# Patient Record
Sex: Female | Born: 1935
Health system: Southern US, Community
[De-identification: ages and names within clinical notes are randomized; demographics above are authoritative.]

## PROBLEM LIST (undated history)

## (undated) DIAGNOSIS — M1990 Unspecified osteoarthritis, unspecified site: Secondary | ICD-10-CM

## (undated) DIAGNOSIS — D500 Iron deficiency anemia secondary to blood loss (chronic): Secondary | ICD-10-CM

## (undated) DIAGNOSIS — K219 Gastro-esophageal reflux disease without esophagitis: Secondary | ICD-10-CM

## (undated) DIAGNOSIS — I10 Essential (primary) hypertension: Secondary | ICD-10-CM

## (undated) DIAGNOSIS — R748 Abnormal levels of other serum enzymes: Secondary | ICD-10-CM

## (undated) DIAGNOSIS — Z86010 Personal history of colonic polyps: Secondary | ICD-10-CM

## (undated) DIAGNOSIS — E0590 Thyrotoxicosis, unspecified without thyrotoxic crisis or storm: Secondary | ICD-10-CM

## (undated) DIAGNOSIS — K922 Gastrointestinal hemorrhage, unspecified: Secondary | ICD-10-CM

## (undated) DIAGNOSIS — C4442 Squamous cell carcinoma of skin of scalp and neck: Secondary | ICD-10-CM

## (undated) DIAGNOSIS — K5730 Diverticulosis of large intestine without perforation or abscess without bleeding: Secondary | ICD-10-CM

## (undated) DIAGNOSIS — L409 Psoriasis, unspecified: Secondary | ICD-10-CM

## (undated) HISTORY — PX: APPENDECTOMY: SHX54

## (undated) HISTORY — DX: Psoriasis, unspecified: L40.9

## (undated) HISTORY — DX: Gastrointestinal hemorrhage, unspecified: K92.2

## (undated) HISTORY — DX: Personal history of colonic polyps: Z86.010

## (undated) HISTORY — DX: Essential (primary) hypertension: I10

## (undated) HISTORY — PX: ABDOMINAL HYSTERECTOMY: SHX81

## (undated) HISTORY — DX: Squamous cell carcinoma of skin of scalp and neck: C44.42

## (undated) HISTORY — DX: Gastro-esophageal reflux disease without esophagitis: K21.9

## (undated) HISTORY — DX: Diverticulosis of large intestine without perforation or abscess without bleeding: K57.30

## (undated) HISTORY — PX: SPINAL FUSION: SHX223

## (undated) HISTORY — DX: Unspecified osteoarthritis, unspecified site: M19.90

## (undated) HISTORY — DX: Thyrotoxicosis, unspecified without thyrotoxic crisis or storm: E05.90

## (undated) HISTORY — DX: Iron deficiency anemia secondary to blood loss (chronic): D50.0

## (undated) HISTORY — PX: ESOPHAGOGASTRODUODENOSCOPY: SHX1529

## (undated) HISTORY — DX: Abnormal levels of other serum enzymes: R74.8

---

## 1997-11-13 ENCOUNTER — Encounter: Payer: Self-pay | Admitting: Internal Medicine

## 1997-11-13 ENCOUNTER — Ambulatory Visit (HOSPITAL_COMMUNITY): Admission: RE | Admit: 1997-11-13 | Discharge: 1997-11-13 | Payer: Self-pay | Admitting: Internal Medicine

## 1998-10-01 ENCOUNTER — Ambulatory Visit (HOSPITAL_COMMUNITY): Admission: RE | Admit: 1998-10-01 | Discharge: 1998-10-01 | Payer: Self-pay | Admitting: Internal Medicine

## 1998-10-01 ENCOUNTER — Encounter: Payer: Self-pay | Admitting: Internal Medicine

## 1998-10-08 ENCOUNTER — Encounter: Payer: Self-pay | Admitting: Internal Medicine

## 1998-10-08 ENCOUNTER — Ambulatory Visit (HOSPITAL_COMMUNITY): Admission: RE | Admit: 1998-10-08 | Discharge: 1998-10-08 | Payer: Self-pay | Admitting: Internal Medicine

## 1998-11-27 ENCOUNTER — Ambulatory Visit (HOSPITAL_COMMUNITY): Admission: RE | Admit: 1998-11-27 | Discharge: 1998-11-27 | Payer: Self-pay | Admitting: Internal Medicine

## 1998-11-27 ENCOUNTER — Encounter: Payer: Self-pay | Admitting: Internal Medicine

## 1999-12-01 ENCOUNTER — Encounter: Payer: Self-pay | Admitting: Internal Medicine

## 1999-12-01 ENCOUNTER — Ambulatory Visit (HOSPITAL_COMMUNITY): Admission: RE | Admit: 1999-12-01 | Discharge: 1999-12-01 | Payer: Self-pay | Admitting: Internal Medicine

## 2000-04-19 ENCOUNTER — Encounter: Payer: Self-pay | Admitting: Emergency Medicine

## 2000-04-19 ENCOUNTER — Emergency Department (HOSPITAL_COMMUNITY): Admission: EM | Admit: 2000-04-19 | Discharge: 2000-04-19 | Payer: Self-pay | Admitting: Emergency Medicine

## 2000-12-17 ENCOUNTER — Encounter: Payer: Self-pay | Admitting: Internal Medicine

## 2000-12-17 ENCOUNTER — Ambulatory Visit (HOSPITAL_COMMUNITY): Admission: RE | Admit: 2000-12-17 | Discharge: 2000-12-17 | Payer: Self-pay | Admitting: Internal Medicine

## 2002-03-13 ENCOUNTER — Ambulatory Visit (HOSPITAL_COMMUNITY): Admission: RE | Admit: 2002-03-13 | Discharge: 2002-03-13 | Payer: Self-pay | Admitting: Internal Medicine

## 2002-03-13 ENCOUNTER — Encounter: Payer: Self-pay | Admitting: Internal Medicine

## 2003-03-28 ENCOUNTER — Ambulatory Visit (HOSPITAL_COMMUNITY): Admission: RE | Admit: 2003-03-28 | Discharge: 2003-03-28 | Payer: Self-pay | Admitting: Internal Medicine

## 2003-11-27 ENCOUNTER — Ambulatory Visit: Payer: Self-pay | Admitting: Internal Medicine

## 2004-01-15 ENCOUNTER — Ambulatory Visit: Payer: Self-pay | Admitting: Internal Medicine

## 2004-05-29 ENCOUNTER — Ambulatory Visit (HOSPITAL_COMMUNITY): Admission: RE | Admit: 2004-05-29 | Discharge: 2004-05-29 | Payer: Self-pay | Admitting: Internal Medicine

## 2004-11-28 ENCOUNTER — Ambulatory Visit: Payer: Self-pay | Admitting: Internal Medicine

## 2005-02-11 ENCOUNTER — Ambulatory Visit: Payer: Self-pay | Admitting: Internal Medicine

## 2005-09-11 ENCOUNTER — Ambulatory Visit: Payer: Self-pay | Admitting: Internal Medicine

## 2005-09-14 ENCOUNTER — Ambulatory Visit (HOSPITAL_COMMUNITY): Admission: RE | Admit: 2005-09-14 | Discharge: 2005-09-14 | Payer: Self-pay | Admitting: Internal Medicine

## 2006-02-24 ENCOUNTER — Ambulatory Visit: Payer: Self-pay | Admitting: Internal Medicine

## 2006-02-24 LAB — CONVERTED CEMR LAB
AST: 45 units/L — ABNORMAL HIGH (ref 0–37)
BUN: 10 mg/dL (ref 6–23)
CO2: 29 meq/L (ref 19–32)
Cholesterol: 193 mg/dL (ref 0–200)
Creatinine, Ser: 0.9 mg/dL (ref 0.4–1.2)
Eosinophils Absolute: 0.2 10*3/uL (ref 0.0–0.6)
GFR calc Af Amer: 78 mL/min
Glucose, Bld: 75 mg/dL (ref 70–99)
MCHC: 35.2 g/dL (ref 30.0–36.0)
MCV: 86.3 fL (ref 78.0–100.0)
Neutro Abs: 2.7 10*3/uL (ref 1.4–7.7)
Potassium: 4.4 meq/L (ref 3.5–5.1)
RBC: 4.89 M/uL (ref 3.87–5.11)
RDW: 13.3 % (ref 11.5–14.6)
TSH: 3.75 microintl units/mL (ref 0.35–5.50)
Total CHOL/HDL Ratio: 5.9
VLDL: 39 mg/dL (ref 0–40)

## 2006-05-04 ENCOUNTER — Ambulatory Visit: Payer: Self-pay | Admitting: Internal Medicine

## 2006-05-19 ENCOUNTER — Ambulatory Visit: Payer: Self-pay | Admitting: Internal Medicine

## 2006-08-02 ENCOUNTER — Encounter: Payer: Self-pay | Admitting: Internal Medicine

## 2006-08-02 DIAGNOSIS — I10 Essential (primary) hypertension: Secondary | ICD-10-CM | POA: Insufficient documentation

## 2006-08-02 DIAGNOSIS — Z860100 Personal history of colon polyps, unspecified: Secondary | ICD-10-CM | POA: Insufficient documentation

## 2006-08-02 DIAGNOSIS — E0590 Thyrotoxicosis, unspecified without thyrotoxic crisis or storm: Secondary | ICD-10-CM

## 2006-08-02 DIAGNOSIS — K219 Gastro-esophageal reflux disease without esophagitis: Secondary | ICD-10-CM

## 2006-08-02 DIAGNOSIS — M1990 Unspecified osteoarthritis, unspecified site: Secondary | ICD-10-CM | POA: Insufficient documentation

## 2006-08-02 DIAGNOSIS — K5730 Diverticulosis of large intestine without perforation or abscess without bleeding: Secondary | ICD-10-CM

## 2006-08-02 HISTORY — DX: Personal history of colonic polyps: Z86.010

## 2006-08-02 HISTORY — DX: Thyrotoxicosis, unspecified without thyrotoxic crisis or storm: E05.90

## 2006-08-02 HISTORY — DX: Diverticulosis of large intestine without perforation or abscess without bleeding: K57.30

## 2006-08-02 HISTORY — DX: Gastro-esophageal reflux disease without esophagitis: K21.9

## 2006-08-02 HISTORY — DX: Personal history of colon polyps, unspecified: Z86.0100

## 2006-08-02 HISTORY — DX: Essential (primary) hypertension: I10

## 2006-08-02 HISTORY — DX: Unspecified osteoarthritis, unspecified site: M19.90

## 2007-03-29 ENCOUNTER — Ambulatory Visit (HOSPITAL_COMMUNITY): Admission: RE | Admit: 2007-03-29 | Discharge: 2007-03-29 | Payer: Self-pay | Admitting: Internal Medicine

## 2007-04-11 ENCOUNTER — Encounter: Payer: Self-pay | Admitting: Internal Medicine

## 2007-04-12 ENCOUNTER — Ambulatory Visit: Payer: Self-pay | Admitting: Internal Medicine

## 2007-04-12 LAB — CONVERTED CEMR LAB
ALT: 38 units/L — ABNORMAL HIGH (ref 0–35)
Albumin: 4.2 g/dL (ref 3.5–5.2)
Alkaline Phosphatase: 45 units/L (ref 39–117)
BUN: 11 mg/dL (ref 6–23)
Basophils Relative: 0.7 % (ref 0.0–1.0)
Calcium: 9.9 mg/dL (ref 8.4–10.5)
Cholesterol: 185 mg/dL (ref 0–200)
Creatinine, Ser: 0.9 mg/dL (ref 0.4–1.2)
Eosinophils Relative: 3.3 % (ref 0.0–5.0)
GFR calc non Af Amer: 64 mL/min
HDL: 28.3 mg/dL — ABNORMAL LOW (ref >39.0)
Lymphocytes Relative: 33.1 % (ref 12.0–46.0)
MCV: 88 fL (ref 78.0–100.0)
Platelets: 167 10*3/uL (ref 150–400)
TSH: 4 microintl units/mL (ref 0.35–5.50)
Total Bilirubin: 1.1 mg/dL (ref 0.3–1.2)
Triglycerides: 223 mg/dL (ref 0–149)
VLDL: 45 mg/dL — ABNORMAL HIGH (ref 0–40)

## 2007-07-19 ENCOUNTER — Telehealth: Payer: Self-pay | Admitting: Internal Medicine

## 2007-07-19 ENCOUNTER — Encounter: Payer: Self-pay | Admitting: Internal Medicine

## 2007-12-26 ENCOUNTER — Ambulatory Visit: Payer: Self-pay | Admitting: Internal Medicine

## 2008-01-20 LAB — HM COLONOSCOPY

## 2008-07-02 ENCOUNTER — Encounter: Payer: Self-pay | Admitting: Internal Medicine

## 2008-07-03 ENCOUNTER — Encounter (HOSPITAL_COMMUNITY): Admission: RE | Admit: 2008-07-03 | Discharge: 2008-09-04 | Payer: Self-pay | Admitting: Gastroenterology

## 2008-07-12 ENCOUNTER — Encounter: Payer: Self-pay | Admitting: Internal Medicine

## 2008-07-16 ENCOUNTER — Encounter: Payer: Self-pay | Admitting: Internal Medicine

## 2008-08-28 ENCOUNTER — Ambulatory Visit: Payer: Self-pay | Admitting: Internal Medicine

## 2008-11-06 ENCOUNTER — Encounter: Payer: Self-pay | Admitting: Internal Medicine

## 2010-04-28 LAB — CROSSMATCH
ABO/RH(D): B NEG
Antibody Screen: NEGATIVE

## 2010-05-23 ENCOUNTER — Encounter: Payer: Self-pay | Admitting: Internal Medicine

## 2010-05-27 ENCOUNTER — Encounter: Payer: Self-pay | Admitting: Internal Medicine

## 2010-05-28 ENCOUNTER — Ambulatory Visit (INDEPENDENT_AMBULATORY_CARE_PROVIDER_SITE_OTHER): Payer: Medicare Other | Admitting: Internal Medicine

## 2010-05-28 ENCOUNTER — Telehealth: Payer: Self-pay | Admitting: Internal Medicine

## 2010-05-28 ENCOUNTER — Encounter: Payer: Self-pay | Admitting: Internal Medicine

## 2010-05-28 VITALS — BP 112/80 | HR 74 | Temp 98.0°F | Resp 16 | Ht 63.0 in | Wt 159.0 lb

## 2010-05-28 DIAGNOSIS — E039 Hypothyroidism, unspecified: Secondary | ICD-10-CM

## 2010-05-28 DIAGNOSIS — K219 Gastro-esophageal reflux disease without esophagitis: Secondary | ICD-10-CM

## 2010-05-28 DIAGNOSIS — K922 Gastrointestinal hemorrhage, unspecified: Secondary | ICD-10-CM

## 2010-05-28 DIAGNOSIS — Z86010 Personal history of colonic polyps: Secondary | ICD-10-CM

## 2010-05-28 DIAGNOSIS — L408 Other psoriasis: Secondary | ICD-10-CM

## 2010-05-28 DIAGNOSIS — L409 Psoriasis, unspecified: Secondary | ICD-10-CM | POA: Insufficient documentation

## 2010-05-28 DIAGNOSIS — K5730 Diverticulosis of large intestine without perforation or abscess without bleeding: Secondary | ICD-10-CM

## 2010-05-28 DIAGNOSIS — Z0000 Encounter for general adult medical examination without abnormal findings: Secondary | ICD-10-CM

## 2010-05-28 DIAGNOSIS — I10 Essential (primary) hypertension: Secondary | ICD-10-CM

## 2010-05-28 LAB — BASIC METABOLIC PANEL
CO2: 27 mEq/L (ref 19–32)
Calcium: 9.3 mg/dL (ref 8.4–10.5)
Creatinine, Ser: 0.9 mg/dL (ref 0.4–1.2)
GFR: 66.22 mL/min (ref >60.00)
Glucose, Bld: 95 mg/dL (ref 70–99)

## 2010-05-28 LAB — CBC WITH DIFFERENTIAL/PLATELET
Basophils Relative: 0.3 % (ref 0.0–3.0)
Eosinophils Absolute: 0.2 10*3/uL (ref 0.0–0.7)
Eosinophils Relative: 3.1 % (ref 0.0–5.0)
Lymphs Abs: 1.7 10*3/uL (ref 0.7–4.0)
Monocytes Absolute: 0.4 10*3/uL (ref 0.1–1.0)
Neutro Abs: 4.1 10*3/uL (ref 1.4–7.7)
Neutrophils Relative %: 64.1 % (ref 43.0–77.0)
RBC: 4.1 Mil/uL (ref 3.87–5.11)
RDW: 14.3 % (ref 11.5–14.6)
WBC: 6.3 10*3/uL (ref 4.5–10.5)

## 2010-05-28 LAB — HEPATIC FUNCTION PANEL
ALT: 45 U/L — ABNORMAL HIGH (ref 0–35)
Albumin: 4.1 g/dL (ref 3.5–5.2)
Alkaline Phosphatase: 46 U/L (ref 39–117)
Total Protein: 6.6 g/dL (ref 6.0–8.3)

## 2010-05-28 LAB — LIPID PANEL
HDL: 30.2 mg/dL — ABNORMAL LOW (ref >39.00)
Total CHOL/HDL Ratio: 5
VLDL: 47 mg/dL — ABNORMAL HIGH (ref 0.0–40.0)

## 2010-05-28 MED ORDER — LANSOPRAZOLE 30 MG PO CPDR: 30.0000 mg | DELAYED_RELEASE_CAPSULE | Freq: Every day | ORAL | Status: DC

## 2010-05-28 MED ORDER — TRIAMCINOLONE ACETONIDE 0.5 % EX OINT: TOPICAL_OINTMENT | Freq: Two times a day (BID) | CUTANEOUS | Status: DC

## 2010-05-28 MED ORDER — BISOPROLOL-HYDROCHLOROTHIAZIDE 5-6.25 MG PO TABS: 1.0000 | ORAL_TABLET | Freq: Every day | ORAL | Status: DC

## 2010-05-28 NOTE — Telephone Encounter (Signed)
LOOKS LIKE HER GI DOCTOR IS DR HUNG - HE DID COLON, EGD , AND CAPSULE ENDOSCOPY IN 2010. WE'RE HAPPY TO SEE, BUT WHY ARE THEY SENDING HER HERE?

## 2010-05-28 NOTE — Telephone Encounter (Signed)
Called and left message for Terrie to call back regarding appt.

## 2010-05-28 NOTE — Progress Notes (Signed)
Subjective:    Patient ID: [name], female    DOB: Nov 29, 1926, 77 y.o.   MRN: [id number]  HPI  77 year old patient who is seen today for her annual health assessment. She has a history of GI bleeding in the past 3 days has had melena. She did have a complete GI evaluation in 2010 that included capsule endoscopy due to GI bleeding. She required outpatient blood transfusions at that time. She has been followed by [facility]. She has a history of hypertension which has been stable she has gastroesophageal reflux disease diverticulosis and history of colonic polyps.  1. Risk factors, based on past  M,S,F history- cardiovascular risk factors include a history of hypertension as well as age  77.  Physical activities: No activity restrictions. Lives independently  3.  Depression/mood: No history of depression or mood disorder  4.  Hearing: No deficits  5.  ADL's: Independent in all aspect of daily living  6.  Fall risk: Low  7.  Home safety: No problems identified  8.  Height weight, and visual acuity; height and weight stable. No change in visual acuity  9.  Counseling: Will need GI followup tomorrow. Will need close followup with H&H  10. Lab orders based on risk factors: Complete laboratory screen including TSH and hematologic studies will be reviewed  11. Referral :  Will be seen by [facility] this week  12. Care plan: Heart healthy diet regular exercise all encouraged  13. Cognitive assessment: Alert and oriented with normal affect. No cognitive dysfunction   History of Present Illness:  77 year old patient who is in for follow-up of her hypertension. In such last visit here. She has had a complete GI evaluation apparently, due to anemia and GI bleeding. This included upper and lower endoscopy as well as capsule endoscopy. Today, she feels well. She has treated hypertension. No concerns or complaints. Apparently, [facility]. These procedures. will attempt to obtain records.  Patient states that she has had recent laboratory studies. She has a history of osteoarthritis, gastroesophageal reflux disease, and prior polyps  Allergies:  No Known Drug Allergies  Past History:  Past Medical History:  Reviewed history from 08/02/2006 and no changes required.  Colonic polyps, hx of  GERD  Hypertension  Osteoarthritis  Hyperthyroidism, S/P 131  Diverticulosis, colon  Past Surgical History:  Appendectomy  Hysterectomy 1993  colonoscopy 2008, 2010  EGD 2010  Family History:  Reviewed history from 04/12/2007 and no changes required.  father died age 25. Hemorrhagic stroke  mother died age 52, pancreatic cancer  One brother died of an MI at age 58  one sister died age 88. Complications of acute rheumatic fever  one sister died of complications following a right carotid endarterectomy     Review of Systems  Constitutional: Negative for fever, appetite change, fatigue and unexpected weight change.  HENT: Negative for hearing loss, ear pain, nosebleeds, congestion, sore throat, mouth sores, trouble swallowing, neck stiffness, dental problem, voice change, sinus pressure and tinnitus.   Eyes: Negative for photophobia, pain, redness and visual disturbance.  Respiratory: Negative for cough, chest tightness and shortness of breath.   Cardiovascular: Negative for chest pain, palpitations and leg swelling.  Gastrointestinal: Positive for blood in stool. Negative for nausea, vomiting, abdominal pain, diarrhea, constipation, abdominal distention and rectal pain.  Genitourinary: Negative for dysuria, urgency, frequency, hematuria, flank pain, vaginal bleeding, vaginal discharge, difficulty urinating, genital sores, vaginal pain, menstrual problem and pelvic pain.  Musculoskeletal: Negative for back  pain and arthralgias.  Skin: Negative for rash.  Neurological: Negative for dizziness, syncope, speech difficulty, weakness, light-headedness, numbness and headaches.    Hematological: Negative for adenopathy. Does not bruise/bleed easily.  Psychiatric/Behavioral: Negative for suicidal ideas, behavioral problems, self-injury, dysphoric mood and agitation. The patient is not nervous/anxious.        Objective:   Physical Exam  Constitutional: She is oriented to person, place, and time. She appears well-developed and well-nourished.  HENT:  Head: Normocephalic and atraumatic.  Right Ear: External ear normal.  Left Ear: External ear normal.  Mouth/Throat: Oropharynx is clear and moist.  Eyes: Conjunctivae and EOM are normal.  Neck: Normal range of motion. Neck supple. No JVD present. No thyromegaly present.  Cardiovascular: Normal rate, regular rhythm, normal heart sounds and intact distal pulses.   No murmur heard. Pulmonary/Chest: Effort normal and breath sounds normal. She has no wheezes. She has no rales.  Abdominal: Soft. Bowel sounds are normal. She exhibits no distension and no mass. There is no tenderness. There is no rebound and no guarding.  Genitourinary: Vagina normal. Guaiac positive stool.       Melanotic heme positive stool  Musculoskeletal: Normal range of motion. She exhibits no edema and no tenderness.  Neurological: She is alert and oriented to person, place, and time. She has normal reflexes. No cranial nerve deficit. She exhibits normal muscle tone. Coordination normal.  Skin: Skin is warm and dry. Rash noted.       Psoriatic skin lesions in the periumbilical area as well as the back region  Psychiatric: She has a normal mood and affect. Her behavior is normal.          Assessment & Plan:   GI bleeding. History of diverticulosis. History of occult GI bleeding 2010 with negative GI workup Hypertension stable Annual health assessment  We'll check lab studies today including CBC. We'll set up to see GI tomorrow

## 2010-05-28 NOTE — Patient Instructions (Signed)
Followup with [facility] this week Call immediately if you develop weakness or dizziness  Avoids foods high in acid such as tomatoes citrus juices, and spicy foods.  Avoid eating within two hours of lying down or before exercising.  Do not overheat.  Try smaller more frequent meals.  If symptoms persist, elevate the head of her bed 12 inches while sleeping.  Limit your sodium (Salt) intake  Return in 3 months for follow-up

## 2010-05-28 NOTE — Telephone Encounter (Signed)
[name] called and spoke with the [name] and she requested to stay with the [facility]. Dr. Kirtland Bouchard saw her today and requested that she be seen due to melena in the stool for the past 3 days. [name] will keep her appt with Gunnar Fusi tomorrow at 2pm.

## 2010-05-28 NOTE — Telephone Encounter (Signed)
Pt has history of GI bleed. Dr. Madison Hickman would like pt seen tomorrow. Dr. Kirtland Bouchard states the last time the pt had problems she wound up needing a transfusion. Scheduled pt to see Willette Cluster NP 05/29/10@2pm . Scheduled with Terrie at Dr. Charm Rings office, she will notify pt of appt date and time. Pt had colon with Dr. Marina Goodell 04/2006.

## 2010-05-28 NOTE — Telephone Encounter (Signed)
OK GREAT

## 2010-05-29 ENCOUNTER — Encounter: Payer: Self-pay | Admitting: Nurse Practitioner

## 2010-05-29 ENCOUNTER — Telehealth: Payer: Self-pay

## 2010-05-29 ENCOUNTER — Ambulatory Visit (INDEPENDENT_AMBULATORY_CARE_PROVIDER_SITE_OTHER): Payer: Medicare Other | Admitting: Nurse Practitioner

## 2010-05-29 VITALS — BP 116/64 | HR 68 | Ht 63.0 in | Wt 160.8 lb

## 2010-05-29 DIAGNOSIS — K219 Gastro-esophageal reflux disease without esophagitis: Secondary | ICD-10-CM

## 2010-05-29 DIAGNOSIS — R748 Abnormal levels of other serum enzymes: Secondary | ICD-10-CM

## 2010-05-29 DIAGNOSIS — K921 Melena: Secondary | ICD-10-CM

## 2010-05-29 MED ORDER — PEG-KCL-NACL-NASULF-NA ASC-C 100 G PO SOLR: ORAL | Status: DC

## 2010-05-29 NOTE — Progress Notes (Signed)
05/29/2010 [name] [id number] 1926/11/06   History of Present Illness:  [name] is an 77 year old female referred here for evaluation of melana. Patient is a long-standing patient of Dr. Marina Goodell who has followed her for adenomatous colon polyps, diverticulosis, GERD / esophageal stricture.She was last seen at time of her surveillance colonoscopy in 2008.  In June of 2010 the patient developed melanotic stools. For unclear reasons the patient was referred to Dr. Elnoria Howard by her primary care doctor's office. EGD by Dr. Elnoria Howard in June 2010 revealed multiple gastric polyps which were not removed. Colonoscopy June 2010 revealed scattered diverticula and medium sized hemorrhoids. Of note the bowel prep was good and extent of the exam was to the terminal ileum which was intubated for short distance. Small bowel capsule study showed the gastric polyps but was otherwise normal. Patient is here today for evaluation of recurrent melena. She wanted to return to Dr. Marina Goodell for evaluation. She began having black stools approximately 3 days ago. Patient saw her primary care physician yesterday. Hemoglobin was 12.6. I don't have any other recent labs for comparison but her hemoglobin in October 2010 and was 13.5.  Patient is not taking any NSAIDS. She denies abdominal pain, nausea or vomiting. No shortness of breath or dizziness.    Past Medical History  Diagnosis Date  . COLONIC POLYPS, HX OF 08/02/2006  . DIVERTICULOSIS, COLON 08/02/2006  . GERD 08/02/2006  . HYPERTENSION 08/02/2006  . HYPERTHYROIDISM 08/02/2006  . OSTEOARTHRITIS 08/02/2006                                                                                                                                                                                                                                                                                                                         Past Surgical History  Procedure Date  . Appendectomy   . Abdominal  hysterectomy   . Esophagogastroduodenoscopy   . Spinal fusion     x2    reports  that she has never smoked. She has never used smokeless tobacco. She reports that she does not drink alcohol or use illicit drugs. family history includes Heart attack in her brother; Heart disease in her sister; Pancreatic cancer in her mother; Rheumatic fever (age of onset:75) in her sister; and Stroke in her father. No Known Allergies   Outpatient Encounter Prescriptions as of 05/29/2010  Medication Sig Dispense Refill  . bisoprolol-hydrochlorothiazide (ZIAC) 5-6.25 MG per tablet Take 1 tablet by mouth daily.  90 tablet  6  . lansoprazole (PREVACID) 30 MG capsule Take 1 capsule (30 mg total) by mouth daily.  90 capsule  6  . Multiple Vitamin (MULTIVITAMIN) tablet Take 1 tablet by mouth daily.        Marland Kitchen triamcinolone (KENALOG) 0.5 % ointment Apply topically 2 (two) times daily.  60 g  0    ROS: All other systems reviewed and negative except where noted in the History of Present Illness.   Physical Exam: General: Well developed white female in no acute distress Head: Normocephalic and atraumatic Eyes:  sclerae anicteric,conjunctive pink. Ears: Normal auditory acuity Mouth: No deformity or lesions Neck: Supple, no masses.  Lungs: Clear throughout to auscultation Heart: Regular rate and rhythm; no murmurs heard Abdomen: Soft, non tender and non distended. No masses or hepatomegaly noted. Normal Bowel sounds Rectal: Small amount of black, heme positive stool in vault. Large area of psoriasis spread across buttocks. Musculoskeletal: Symmetrical with no gross deformities  Skin: No lesions on visible extremities Extremities: No edema or deformities noted Neurological: Alert oriented x 4, grossly nonfocal Cervical Nodes:  No significant cervical adenopathy Psychological:  Alert and cooperative. Normal mood and affect  Assessment and Plan: Melena Recurrent melena. Hemoglobin yesterday was 12.6 (no recent  labs to compare). It should also be noted that her BUN was normal. Workup in 2010 for melena was unremarkable including an upper endoscopy, colonoscopy, and small bowel capsule study (Dr. Elnoria Howard). Despite negative workup in 2010, patient will need repeat endoscopic workup and possibly repeat small bowel capsule study. The risks, benefits, and alternatives to EGD / colonoscopy with possible biopsy and possible polypectomy were discussed with the patient and she consents to proceed.  Fortunately, Dr. Marina Goodell has an opening on his endoscopy schedule for tomorrow.   Elevated liver enzymes Chronic, mild transaminitis. AST 48, ALT 45, LFTs otherwise normal.

## 2010-05-29 NOTE — Patient Instructions (Signed)
We have scheduled the Endoscopy/Colonoscopy with Dr. Yancey Flemings on Friday 05-30-2010. Directions and brochure provided. We sent a prescription to your pharmacy, [facility] for the colonoscopy prep.

## 2010-05-29 NOTE — Telephone Encounter (Signed)
Called [facility] r/t rx's recv'd electroniclly - after speaking with Shanda Bumps - she states pt account no longer active. I will contact pt.   Spoke with pt - informed of what [facility] said - she will contact them and them contact us for re-order of meds. KIK

## 2010-05-29 NOTE — Assessment & Plan Note (Signed)
Chronic, mild transaminitis. AST 48, ALT 45, LFTs otherwise normal.

## 2010-05-30 ENCOUNTER — Telehealth: Payer: Self-pay | Admitting: *Deleted

## 2010-05-30 ENCOUNTER — Observation Stay (HOSPITAL_COMMUNITY)
Admission: RE | Admit: 2010-05-30 | Discharge: 2010-05-31 | Disposition: A | Discharge status: Home / Self Care | Payer: Medicare Other | Source: Ambulatory Visit | Attending: Internal Medicine | Admitting: Internal Medicine

## 2010-05-30 ENCOUNTER — Encounter: Payer: Medicare Other | Admitting: Internal Medicine

## 2010-05-30 ENCOUNTER — Emergency Department (HOSPITAL_COMMUNITY)
Admission: EM | Admit: 2010-05-30 | Discharge: 2010-05-30 | Disposition: A | Discharge status: Home / Self Care | Payer: Medicare Other | Source: Home / Self Care | Attending: Emergency Medicine | Admitting: Emergency Medicine

## 2010-05-30 DIAGNOSIS — K648 Other hemorrhoids: Secondary | ICD-10-CM | POA: Insufficient documentation

## 2010-05-30 DIAGNOSIS — D131 Benign neoplasm of stomach: Secondary | ICD-10-CM | POA: Insufficient documentation

## 2010-05-30 DIAGNOSIS — Z01812 Encounter for preprocedural laboratory examination: Secondary | ICD-10-CM | POA: Insufficient documentation

## 2010-05-30 DIAGNOSIS — M1990 Unspecified osteoarthritis, unspecified site: Secondary | ICD-10-CM | POA: Insufficient documentation

## 2010-05-30 DIAGNOSIS — K5730 Diverticulosis of large intestine without perforation or abscess without bleeding: Secondary | ICD-10-CM | POA: Insufficient documentation

## 2010-05-30 DIAGNOSIS — D500 Iron deficiency anemia secondary to blood loss (chronic): Secondary | ICD-10-CM | POA: Insufficient documentation

## 2010-05-30 DIAGNOSIS — K219 Gastro-esophageal reflux disease without esophagitis: Secondary | ICD-10-CM | POA: Insufficient documentation

## 2010-05-30 DIAGNOSIS — K921 Melena: Secondary | ICD-10-CM

## 2010-05-30 DIAGNOSIS — Z79899 Other long term (current) drug therapy: Secondary | ICD-10-CM | POA: Insufficient documentation

## 2010-05-30 DIAGNOSIS — I10 Essential (primary) hypertension: Secondary | ICD-10-CM | POA: Insufficient documentation

## 2010-05-30 DIAGNOSIS — K625 Hemorrhage of anus and rectum: Secondary | ICD-10-CM | POA: Insufficient documentation

## 2010-05-30 DIAGNOSIS — K922 Gastrointestinal hemorrhage, unspecified: Principal | ICD-10-CM | POA: Insufficient documentation

## 2010-05-30 DIAGNOSIS — D62 Acute posthemorrhagic anemia: Secondary | ICD-10-CM

## 2010-05-30 LAB — CBC
HCT: 33.2 % — ABNORMAL LOW (ref 36.0–46.0)
Platelets: 141 10*3/uL — ABNORMAL LOW (ref 150–400)
WBC: 5.4 10*3/uL (ref 4.0–10.5)

## 2010-05-30 LAB — DIFFERENTIAL
Basophils Absolute: 0 10*3/uL (ref 0.0–0.1)
Basophils Relative: 1 % (ref 0–1)
Eosinophils Absolute: 0.2 10*3/uL (ref 0.0–0.7)
Eosinophils Relative: 3 % (ref 0–5)
Lymphocytes Relative: 31 % (ref 12–46)
Lymphs Abs: 1.7 10*3/uL (ref 0.7–4.0)
Monocytes Absolute: 0.4 10*3/uL (ref 0.1–1.0)

## 2010-05-30 LAB — HEMOGLOBIN AND HEMATOCRIT, BLOOD: Hemoglobin: 9.9 g/dL — ABNORMAL LOW (ref 12.0–15.0)

## 2010-05-30 NOTE — Telephone Encounter (Signed)
Patient called, wondering if we can still do procedure since her bowel movements are still "black". Pt. States she has finished both parts of Moviprep and except for a small out of yellow stool passed during the night, she is having black liquid stools. States she feels fine, no weakness, is just feeling hungry. Informed Dr. Marina Goodell of this, he wants her to go straight to [facility] ER. Notified patient of this and she says she is on her way.

## 2010-05-30 NOTE — Telephone Encounter (Signed)
Agree. I have discussed with GI hospital PA who will see pt with GI MD

## 2010-05-30 NOTE — Progress Notes (Signed)
Agree with assessment and plans 

## 2010-05-31 LAB — HEMOGLOBIN AND HEMATOCRIT, BLOOD
HCT: 26.7 % — ABNORMAL LOW (ref 36.0–46.0)
HCT: 29.9 % — ABNORMAL LOW (ref 36.0–46.0)
Hemoglobin: 9.4 g/dL — ABNORMAL LOW (ref 12.0–15.0)
Hemoglobin: 10.5 g/dL — ABNORMAL LOW (ref 12.0–15.0)

## 2010-06-02 ENCOUNTER — Telehealth: Payer: Self-pay | Admitting: Internal Medicine

## 2010-06-02 MED ORDER — BISOPROLOL-HYDROCHLOROTHIAZIDE 5-6.25 MG PO TABS: 1.0000 | ORAL_TABLET | Freq: Every day | ORAL | Status: DC

## 2010-06-02 MED ORDER — LANSOPRAZOLE 30 MG PO CPDR: 30.0000 mg | DELAYED_RELEASE_CAPSULE | Freq: Every day | ORAL | Status: DC

## 2010-06-02 NOTE — Telephone Encounter (Signed)
Needs new rxs for generic Ziac and  Brand name only Prevacid sent to new pharmacy[facility].

## 2010-06-03 ENCOUNTER — Telehealth: Payer: Self-pay | Admitting: Internal Medicine

## 2010-06-03 NOTE — Telephone Encounter (Signed)
Pt calling to schedule follow-up from egd/colon. Pt scheduled to see Dr. Marina Goodell 06/09/10@11 :15am. Dr. Marina Goodell when does the pt need to be set up for the capsule endo? Please advise.

## 2010-06-03 NOTE — Telephone Encounter (Signed)
Set up CE ASAP. Do not see me in office until CE completed and read. thanks

## 2010-06-03 NOTE — Telephone Encounter (Signed)
Pt scheduled for capsule teaching 5/18@2pm , Capsule endo for 5/22@8am . Pt aware of appt date and time. Pt will be scheduled for follow-up OV once capsule endo read.

## 2010-06-05 ENCOUNTER — Telehealth: Payer: Self-pay | Admitting: Internal Medicine

## 2010-06-05 DIAGNOSIS — K921 Melena: Secondary | ICD-10-CM

## 2010-06-05 NOTE — Telephone Encounter (Signed)
Pt states she had a colon last week, she had blood in her stool. Pt reports that she is still having black stools. Spoke with pt and she is having a capsule endo done next week. Pt wanted to make sure she didn't need to do anything different. Please advise.

## 2010-06-05 NOTE — Telephone Encounter (Signed)
Pt aware of Dr. Perry's recommendations. 

## 2010-06-05 NOTE — Telephone Encounter (Signed)
Get a cbc this wek; keep CE appt next week

## 2010-06-06 ENCOUNTER — Inpatient Hospital Stay (HOSPITAL_COMMUNITY)
Admission: EM | Admit: 2010-06-06 | Discharge: 2010-06-11 | DRG: 393 | Disposition: A | Discharge status: Home / Self Care | Payer: Medicare Other | Attending: Internal Medicine | Admitting: Internal Medicine

## 2010-06-06 ENCOUNTER — Other Ambulatory Visit (INDEPENDENT_AMBULATORY_CARE_PROVIDER_SITE_OTHER): Payer: Medicare Other

## 2010-06-06 ENCOUNTER — Telehealth: Payer: Self-pay | Admitting: *Deleted

## 2010-06-06 ENCOUNTER — Encounter (INDEPENDENT_AMBULATORY_CARE_PROVIDER_SITE_OTHER): Payer: Medicare Other

## 2010-06-06 ENCOUNTER — Telehealth: Payer: Self-pay

## 2010-06-06 ENCOUNTER — Other Ambulatory Visit: Payer: Self-pay | Admitting: *Deleted

## 2010-06-06 ENCOUNTER — Inpatient Hospital Stay (HOSPITAL_COMMUNITY): Payer: Medicare Other

## 2010-06-06 DIAGNOSIS — I10 Essential (primary) hypertension: Secondary | ICD-10-CM | POA: Diagnosis present

## 2010-06-06 DIAGNOSIS — K5521 Angiodysplasia of colon with hemorrhage: Secondary | ICD-10-CM | POA: Diagnosis present

## 2010-06-06 DIAGNOSIS — E876 Hypokalemia: Secondary | ICD-10-CM | POA: Diagnosis present

## 2010-06-06 DIAGNOSIS — K6381 Dieulafoy lesion of intestine: Principal | ICD-10-CM | POA: Diagnosis present

## 2010-06-06 DIAGNOSIS — K219 Gastro-esophageal reflux disease without esophagitis: Secondary | ICD-10-CM | POA: Diagnosis present

## 2010-06-06 DIAGNOSIS — K921 Melena: Secondary | ICD-10-CM

## 2010-06-06 DIAGNOSIS — K5730 Diverticulosis of large intestine without perforation or abscess without bleeding: Secondary | ICD-10-CM | POA: Diagnosis present

## 2010-06-06 DIAGNOSIS — D131 Benign neoplasm of stomach: Secondary | ICD-10-CM | POA: Diagnosis present

## 2010-06-06 DIAGNOSIS — D62 Acute posthemorrhagic anemia: Secondary | ICD-10-CM | POA: Diagnosis present

## 2010-06-06 LAB — COMPREHENSIVE METABOLIC PANEL
ALT: 32 U/L (ref 0–35)
AST: 33 U/L (ref 0–37)
Albumin: 3.7 g/dL (ref 3.5–5.2)
Alkaline Phosphatase: 47 U/L (ref 39–117)
CO2: 26 mEq/L (ref 19–32)
Chloride: 103 mEq/L (ref 96–112)
Creatinine, Ser: 0.77 mg/dL (ref 0.4–1.2)
GFR calc Af Amer: >60 mL/min (ref >60)
GFR calc non Af Amer: >60 mL/min (ref >60)
Potassium: 3.7 mEq/L (ref 3.5–5.1)
Sodium: 137 mEq/L (ref 135–145)
Total Bilirubin: 0.4 mg/dL (ref 0.3–1.2)

## 2010-06-06 LAB — CBC WITH DIFFERENTIAL/PLATELET
Basophils Absolute: 0 10*3/uL (ref 0.0–0.1)
Eosinophils Relative: 2.4 % (ref 0.0–5.0)
MCV: 88.1 fl (ref 78.0–100.0)
Monocytes Absolute: 0.5 10*3/uL (ref 0.1–1.0)
Monocytes Relative: 7 % (ref 3.0–12.0)
Neutrophils Relative %: 61.6 % (ref 43.0–77.0)
Platelets: 190 10*3/uL (ref 150.0–400.0)
WBC: 6.5 10*3/uL (ref 4.5–10.5)

## 2010-06-06 LAB — CBC
Hemoglobin: 7.8 g/dL — ABNORMAL LOW (ref 12.0–15.0)
MCH: 28.7 pg (ref 26.0–34.0)
RBC: 2.72 MIL/uL — ABNORMAL LOW (ref 3.87–5.11)
WBC: 5.8 10*3/uL (ref 4.0–10.5)

## 2010-06-06 LAB — DIFFERENTIAL
Basophils Relative: 0 % (ref 0–1)
Monocytes Relative: 10 % (ref 3–12)
Neutro Abs: 3.6 10*3/uL (ref 1.7–7.7)
Neutrophils Relative %: 62 % (ref 43–77)

## 2010-06-06 LAB — ABO/RH: ABO/RH(D): B NEG

## 2010-06-06 NOTE — Telephone Encounter (Signed)
Pt came in for labs today. Hgb was 10.5 on 5/12. Today her Hgb is 7.9. Per Dr. Marina Goodell if pt has any more black,dark stools she is to go to the hospital. Pt verbalized understanding. Dr. Christella Hartigan notified that he may be hearing from the pt as he is the on call physician.

## 2010-06-06 NOTE — Telephone Encounter (Signed)
Patient here for instruction and teaching for Capsule Endoscopy by Dr Marina Goodell. Pt was shown leads that will connect to the computer unit for recording and the Capsule she has to swallow. Pt verbalized understanding of all verbal and written instructions for the as well the diet she will follow the day before and day of the procedure.

## 2010-06-06 NOTE — Assessment & Plan Note (Signed)
[facility]                            [facility] OFFICE NOTE   [name], PATCHEN                        MRN:          [id number]  DATE:02/24/2006                            DOB:          06/06/1926    A 77 year old female seen today for a comprehensive exam.  She has  hypertension, gastroesophageal reflux disease, and is status post I-131  treatment.  She has history of chronic polyps, last colonoscopy was in  06-21-2000.  Additionally she has a history of DJD.  Also one month ago had  some lower abdominal pain and some gross hematuria, this has cleared.  Date of insurance exam, about one month ago; apparently was negative  including absent hematuria.  No concerns or complaints today.   FAMILY HISTORY:  Reviewed.  Both parents died quite elderly.  Father  from a hemorrhagic stroke in 22-Jun-1990, mother died of pancreatic cancer at  59.  One brother died at 32 of an MI.   EXAMINATION:  Revealed an elderly white female in no acute distress.  Blood pressure was 122/78.  SKIN:  Negative.  FUNDI, EAR, NOSE, AND THROAT:  Clear.  NECK:  No bruits.  CHEST:  Clear.  BREAST:  Negative.  CARDIOVASCULAR:  Normal heart sounds, no murmurs.  ABDOMEN:  Benign, no organomegaly.  PELVIC:  Revealed absent uterus, no adnexal masses.  STOOL:  No masses, heme negative.  EXTREMITIES:  Revealed faint dorsalis pedis pulses, posterior tibial  pulses were intact.  NEURO:  Negative.   IMPRESSION:  1. Hypertension, status post I-131.  2. Degenerative joint disease.  3. Chronic polyps.  4. History of gastroesophageal reflux disease.   DISPOSITION:  Medical regimen unchanged except generic Protonix will be  substituted for the Prevacid, will reassess in 6 months.   LABORATORY STUDIES:  Including the UA will be reviewed.     Gordy Savers, MD  Electronically Signed    PFK/MedQ  DD: 02/24/2006  DT: 02/24/2006  Job #: [id number]

## 2010-06-07 DIAGNOSIS — K921 Melena: Secondary | ICD-10-CM

## 2010-06-07 DIAGNOSIS — D62 Acute posthemorrhagic anemia: Secondary | ICD-10-CM

## 2010-06-07 LAB — HEMOGLOBIN AND HEMATOCRIT, BLOOD
HCT: 25.3 % — ABNORMAL LOW (ref 36.0–46.0)
Hemoglobin: 8.3 g/dL — ABNORMAL LOW (ref 12.0–15.0)

## 2010-06-07 LAB — PREPARE RBC (CROSSMATCH)

## 2010-06-08 DIAGNOSIS — D131 Benign neoplasm of stomach: Secondary | ICD-10-CM

## 2010-06-08 DIAGNOSIS — K921 Melena: Secondary | ICD-10-CM

## 2010-06-08 DIAGNOSIS — K922 Gastrointestinal hemorrhage, unspecified: Secondary | ICD-10-CM

## 2010-06-08 LAB — COMPREHENSIVE METABOLIC PANEL
ALT: 27 U/L (ref 0–35)
Albumin: 3.2 g/dL — ABNORMAL LOW (ref 3.5–5.2)
BUN: 6 mg/dL (ref 6–23)
Calcium: 8.3 mg/dL — ABNORMAL LOW (ref 8.4–10.5)
Glucose, Bld: 101 mg/dL — ABNORMAL HIGH (ref 70–99)
Sodium: 140 mEq/L (ref 135–145)
Total Protein: 5.4 g/dL — ABNORMAL LOW (ref 6.0–8.3)

## 2010-06-08 LAB — CBC
HCT: 27.1 % — ABNORMAL LOW (ref 36.0–46.0)
MCHC: 33.2 g/dL (ref 30.0–36.0)
Platelets: 141 10*3/uL — ABNORMAL LOW (ref 150–400)
RDW: 15.3 % (ref 11.5–15.5)

## 2010-06-08 LAB — CROSSMATCH: Unit division: 0

## 2010-06-08 LAB — MAGNESIUM: Magnesium: 2.4 mg/dL (ref 1.5–2.5)

## 2010-06-08 LAB — DIFFERENTIAL
Basophils Absolute: 0 10*3/uL (ref 0.0–0.1)
Eosinophils Absolute: 0.2 10*3/uL (ref 0.0–0.7)
Eosinophils Relative: 4 % (ref 0–5)
Lymphocytes Relative: 28 % (ref 12–46)
Monocytes Absolute: 0.4 10*3/uL (ref 0.1–1.0)

## 2010-06-09 ENCOUNTER — Ambulatory Visit: Payer: Medicare Other | Admitting: Internal Medicine

## 2010-06-09 ENCOUNTER — Encounter: Payer: Self-pay | Admitting: Nurse Practitioner

## 2010-06-09 DIAGNOSIS — D62 Acute posthemorrhagic anemia: Secondary | ICD-10-CM

## 2010-06-09 DIAGNOSIS — K921 Melena: Secondary | ICD-10-CM

## 2010-06-09 LAB — COMPREHENSIVE METABOLIC PANEL
Alkaline Phosphatase: 51 U/L (ref 39–117)
BUN: 7 mg/dL (ref 6–23)
CO2: 26 mEq/L (ref 19–32)
Chloride: 108 mEq/L (ref 96–112)
Glucose, Bld: 101 mg/dL — ABNORMAL HIGH (ref 70–99)
Potassium: 3.1 mEq/L — ABNORMAL LOW (ref 3.5–5.1)
Total Bilirubin: 0.5 mg/dL (ref 0.3–1.2)

## 2010-06-09 LAB — MAGNESIUM: Magnesium: 2.4 mg/dL (ref 1.5–2.5)

## 2010-06-09 LAB — DIFFERENTIAL
Lymphocytes Relative: 25 % (ref 12–46)
Lymphs Abs: 1.2 10*3/uL (ref 0.7–4.0)
Neutrophils Relative %: 62 % (ref 43–77)

## 2010-06-09 LAB — CBC
HCT: 28.7 % — ABNORMAL LOW (ref 36.0–46.0)
MCV: 84.4 fL (ref 78.0–100.0)
RBC: 3.4 MIL/uL — ABNORMAL LOW (ref 3.87–5.11)
WBC: 4.6 10*3/uL (ref 4.0–10.5)

## 2010-06-09 LAB — OCCULT BLOOD, POC DEVICE: Fecal Occult Bld: POSITIVE

## 2010-06-10 DIAGNOSIS — K921 Melena: Secondary | ICD-10-CM

## 2010-06-10 DIAGNOSIS — R933 Abnormal findings on diagnostic imaging of other parts of digestive tract: Secondary | ICD-10-CM

## 2010-06-10 DIAGNOSIS — K922 Gastrointestinal hemorrhage, unspecified: Secondary | ICD-10-CM

## 2010-06-10 LAB — DIFFERENTIAL
Basophils Relative: 1 % (ref 0–1)
Eosinophils Absolute: 0.2 10*3/uL (ref 0.0–0.7)
Monocytes Absolute: 0.3 10*3/uL (ref 0.1–1.0)
Monocytes Relative: 7 % (ref 3–12)
Neutro Abs: 2.7 10*3/uL (ref 1.7–7.7)

## 2010-06-10 LAB — CBC
Hemoglobin: 8.9 g/dL — ABNORMAL LOW (ref 12.0–15.0)
Hemoglobin: 9.8 g/dL — ABNORMAL LOW (ref 12.0–15.0)
MCH: 27.4 pg (ref 26.0–34.0)
MCHC: 32.4 g/dL (ref 30.0–36.0)
RBC: 3.52 MIL/uL — ABNORMAL LOW (ref 3.87–5.11)
WBC: 6.7 10*3/uL (ref 4.0–10.5)

## 2010-06-10 LAB — COMPREHENSIVE METABOLIC PANEL
AST: 22 U/L (ref 0–37)
CO2: 26 mEq/L (ref 19–32)
Calcium: 8.2 mg/dL — ABNORMAL LOW (ref 8.4–10.5)
Creatinine, Ser: 0.64 mg/dL (ref 0.4–1.2)
GFR calc Af Amer: >60 mL/min (ref >60)
GFR calc non Af Amer: >60 mL/min (ref >60)
Glucose, Bld: 102 mg/dL — ABNORMAL HIGH (ref 70–99)

## 2010-06-10 LAB — MAGNESIUM: Magnesium: 2.4 mg/dL (ref 1.5–2.5)

## 2010-06-11 ENCOUNTER — Telehealth: Payer: Self-pay | Admitting: *Deleted

## 2010-06-11 DIAGNOSIS — D62 Acute posthemorrhagic anemia: Secondary | ICD-10-CM

## 2010-06-11 DIAGNOSIS — K921 Melena: Secondary | ICD-10-CM

## 2010-06-11 DIAGNOSIS — R933 Abnormal findings on diagnostic imaging of other parts of digestive tract: Secondary | ICD-10-CM

## 2010-06-11 DIAGNOSIS — K922 Gastrointestinal hemorrhage, unspecified: Secondary | ICD-10-CM

## 2010-06-11 LAB — CBC
HCT: 27.7 % — ABNORMAL LOW (ref 36.0–46.0)
Hemoglobin: 8.9 g/dL — ABNORMAL LOW (ref 12.0–15.0)
MCV: 85 fL (ref 78.0–100.0)
RBC: 3.26 MIL/uL — ABNORMAL LOW (ref 3.87–5.11)
WBC: 4.2 10*3/uL (ref 4.0–10.5)

## 2010-06-11 NOTE — Telephone Encounter (Signed)
Per Mike Gip PA-C ,  I made an appointment for pt with Amy for Wed 06-18-2010. Pt also needs to come to the lab before the 1:30 PM appointment. Called the patient to advise, LM on the patients answering machine. Pt was in the hospital .

## 2010-06-13 NOTE — Op Note (Signed)
  NAMEJUSTEEN, [name]                 ACCOUNT NO.:  [id number]  MEDICAL RECORD NO.:  [id number]           PATIENT TYPE:  I  LOCATION:  1442                         FACILITY[facility]  PHYSICIAN:  Iva Boop, MD,FACGDATE OF BIRTH:  09-18-1926  DATE OF PROCEDURE:  06/08/2010 DATE OF DISCHARGE:                              OPERATIVE REPORT   PROCEDURE:  Small bowel capsule endoscopy.  INDICATIONS:  GI bleeding with recent EGD and colonoscopy approximately 10 days ago which were unrevealing.  She has had melena on a recurrent basis over the past several years though none for 2 years.  FINDINGS:  Gastric passage time 17 minutes, small bowel passage time 2 hours and 43 minutes.  Complete study.  1. There was fresh blood in the proximal small bowel.  This was either     in the very distal duodenum or the proximal jejunum.  It occurred     about 10 minutes after the first duodenal image.  No lesion is seen     but there is clearly fresh blood there. 2. Shows several benign-appearing gastric polyps as seen on recent     EGD. 3. Otherwise normal small bowel, some retained bilious fluid in the     distal small bowel limits his ability there somewhat.  PLAN AND RECOMMENDATIONS:  A push enteroscopy will be undertaken tomorrow to evaluate the bleeding source and to hopefully treat it. Question if she has an AVM or AVMs not previously identified.  She did have a capsule endoscopy study a couple of years ago as well that was unrevealing.     Iva Boop, MD,FACG     CEG/MEDQ  D:  06/09/2010  T:  06/09/2010  Job:  [id number]  cc:   Wilhemina Bonito. Marina Goodell, MD [address] [place] [address]  Electronically Signed by Stan Head MDFACG on 06/13/2010 03:13:32 PM

## 2010-06-17 NOTE — H&P (Signed)
NAMEYENTL, VERGE                 ACCOUNT NO.:  [id number]  MEDICAL RECORD NO.:  [id number]           PATIENT TYPE:  E  LOCATION:  WLED                         FACILITY[facility]  [facility]  PHYSICIAN:  Houston Siren, MD           DATE OF BIRTH:  06/23/1926  DATE OF ADMISSION:  06/06/2010 DATE OF DISCHARGE:                             HISTORY & PHYSICAL   PRIMARY CARE PHYSICIAN:  Gordy Savers, MD  GASTROENTEROLOGIST:  Wilhemina Bonito. Marina Goodell, MD.  ADVANCE DIRECTIVE:  Full code.  REASON FOR ADMISSION:  Upper GI bleed.  HISTORY OF PRESENT ILLNESS:  This is an 77 year old female with history of hypertension, GERD, prior upper GI bleed approximately 2 years ago, presents with black stools for 2 weeks and found to have a hemoglobin of 7.8.  She reportedly had EGD and colonoscopy at that admission without any significant findings.  I did not see any result from E chart. Dr. Marina Goodell has sent her in [facility] Emergency Room for admission.  She denied any chest pain or shortness of breath, but she has been feeling weak and has mild dyspnea on exertion.  There has been no nausea, vomiting or any bloody stool.  She has no abdominal pain.  She denies fever or chills.  She has not taken any nonsteroidal anti- inflammatory drugs or any aspirin.  Her workup in emergency room included an INR of 1.15, creatinine of 0.77, BUN of 13.  Her white count is 5800, and her hemoglobin was 7.8 with MCV of 85.  Hospitalist was asked to admit the patient for upper GI bleed.  PAST MEDICAL HISTORY:  Hypertension, GERD, prior upper GI bleed.  SOCIAL HISTORY:  She denied tobacco, alcohol or drug use.  She lives alone.  She had been a widow for about 5 years.  She has one son.  ALLERGIES:  No known drug allergies.  CURRENT MEDICATIONS:  PREVACID and ZIAC, dosages unclear.  REVIEW OF SYSTEMS:  Otherwise unremarkable.  FAMILY HISTORY:  Noncontributory.  PHYSICAL EXAM:  VITAL SIGNS:  Blood pressure is 122/68, pulse of  80, respiratory rate of 18, temperature 98.1. GENERAL:  She is alert and oriented, is in no apparent distress.  She does not look pale. HEENT:  She has facial symmetry and fluent speech.  Conjunctivae pink. She has no icterus. NECK:  Supple.  No lymphadenopathy. CARDIAC EXAM:  Revealed S1, S2 regular.  I did not hear any murmur, rub or gallop. LUNGS:  Clear.  No wheezes, rales or any evidence of consolidation. ABDOMINAL EXAM:  Soft, nondistended, nontender, specifically no epigastric pain.  Bowel sounds present.  No ascites.  No palpable mass. EXTREMITIES:  Showed no edema.  Her palmar crease is dark and her skin is warm and dry.  She has good distal pulses bilaterally. Neurological exam and psychiatric exams unremarkable as well.  She has guaiac positive stool per ER MD.  LABORATORY STUDIES:  Serum sodium 137, potassium 3.7, creatinine 0.77. Liver function tests are all normal.  White count 5800, hemoglobin of 7.8 with MCV of 85, platelet count is 185,000.  INR  of 1.15.  I did not see any EKG on the chart, nor an upright chest x-ray.  ASSESSMENT/PLAN:  This is an 77 year old female with prior gastrointestinal bleed and supposedly negative esophagogastroduodenoscopy and colonoscopy who presents with upper GI bleed.  She is stable, and I suspect that it is a slow bleed.  No symptoms suggestive of ischemic colitis.  We will admit her to telemetry unit.  I will transfuse her 1 unit of packed red blood cells and follow up with her H and H.  Dr. Marina Goodell is aware of her admission and will see her in consultation tomorrow.  We will give her clear liquids tonight and make her n.p.o. for possible EGD in the morning.  We will defer that to GI.  We will give her intravenous fluid.  I will increase her Prevacid to 40 mg b.i.d. and hold her blood pressure medication.  To be complete, we will get an EKG and an upright chest to make sure she does not have any air under the diaphragm.  She is a full  code and will be admitted to [facility].     Houston Siren, MD     PL/MEDQ  D:  06/06/2010  T:  06/06/2010  Job:  [id number]  cc:   Gordy Savers, MD [address] [place] [address]  Wilhemina Bonito. Marina Goodell, MD [address] [place] [address]  Electronically Signed by Houston Siren  on 06/17/2010 09:04:45 PM

## 2010-06-18 ENCOUNTER — Ambulatory Visit (INDEPENDENT_AMBULATORY_CARE_PROVIDER_SITE_OTHER): Payer: Medicare Other | Admitting: Physician Assistant

## 2010-06-18 ENCOUNTER — Encounter: Payer: Self-pay | Admitting: Physician Assistant

## 2010-06-18 ENCOUNTER — Other Ambulatory Visit (INDEPENDENT_AMBULATORY_CARE_PROVIDER_SITE_OTHER): Payer: Medicare Other

## 2010-06-18 ENCOUNTER — Other Ambulatory Visit: Payer: Self-pay | Admitting: *Deleted

## 2010-06-18 VITALS — BP 118/60 | HR 88 | Ht 63.0 in | Wt 156.0 lb

## 2010-06-18 DIAGNOSIS — D649 Anemia, unspecified: Secondary | ICD-10-CM

## 2010-06-18 DIAGNOSIS — K922 Gastrointestinal hemorrhage, unspecified: Secondary | ICD-10-CM

## 2010-06-18 DIAGNOSIS — D509 Iron deficiency anemia, unspecified: Secondary | ICD-10-CM

## 2010-06-18 DIAGNOSIS — D62 Acute posthemorrhagic anemia: Secondary | ICD-10-CM | POA: Insufficient documentation

## 2010-06-18 LAB — CBC WITH DIFFERENTIAL/PLATELET
Basophils Absolute: 0 10*3/uL (ref 0.0–0.1)
Basophils Absolute: 0 10*3/uL (ref 0.0–0.1)
Eosinophils Absolute: 0.1 10*3/uL (ref 0.0–0.7)
Hemoglobin: 9.2 g/dL — ABNORMAL LOW (ref 12.0–15.0)
Hemoglobin: 9.2 g/dL — ABNORMAL LOW (ref 12.0–15.0)
Lymphocytes Relative: 29.4 % (ref 12.0–46.0)
Lymphocytes Relative: 29.4 % (ref 12.0–46.0)
MCHC: 33.7 g/dL (ref 30.0–36.0)
Monocytes Relative: 8.9 % (ref 3.0–12.0)
Neutro Abs: 3.1 10*3/uL (ref 1.4–7.7)
Neutro Abs: 3.1 10*3/uL (ref 1.4–7.7)
Platelets: 133 10*3/uL — ABNORMAL LOW (ref 150.0–400.0)
RDW: 16.1 % — ABNORMAL HIGH (ref 11.5–14.6)
RDW: 16.1 % — ABNORMAL HIGH (ref 11.5–14.6)
nRBC: 0 /100 WBC (ref 0–4)

## 2010-06-18 MED ORDER — INTEGRA PLUS PO CAPS: 1.0000 | ORAL_CAPSULE | Freq: Every day | ORAL | Status: DC

## 2010-06-18 NOTE — Progress Notes (Signed)
  Subjective:    Patient ID: [name], female    DOB: [birth year], 77 y.o.   MRN: [id number]  HPI [name] is a very nice 77 year old white female who was recently hospitalized 518 through 06/11/2010 with a recurrent GI bleed. She is known to Dr. Marina Goodell. Patient had had a previous GI bleed in 2010 and etiology was undetermined with a negative endoscopy, colonoscopy and capsule endoscopy at that time. During this most recent episode patient developed recurrent melena. She underwent repeat endoscopy and colonoscopy by Dr. Marina Goodell on 05/30/2010 and was found to have small sessile gastric polyps which were felt to be incidental and colonoscopy showed moderate diverticulosis within otherwise negative exam including a normal appearing terminal ileum. Patient was to have a capsule endoscopy however presented to the emergency room with increased melena and complaints of weakness. She was admitted found to have a hemoglobin of 7.8 and was transfused 2 units of packed RBCs. She underwent capsule endoscopy which was interpreted by Dr. Elenore Paddy and found to have oozing of blood from the proximal small bowel either distal duodenum or proximal jejunum with no clear lesions seen. She then underwent enteroscopy with fresh oozing of blood noted again and a small red spot which bled actively once it was contacted. This area was then injected with epinephrine, APC'd and Endo Clipped  with good control of the bleeding. She did not manifest any further active bleeding her hemoglobin stabilized at 8.9 and she was allowed discharge on the 23rd. She has not been on any aspirin or NSAIDs and is on Prevacid 30 mg by mouth daily.  Patient states that she has done fine since discharge she has not noted any recurrent melena is having normal appearing bowel movements and has no complaint of abdominal pain. Her only complaint is that of fatigue and weakness  She had hemoglobin done earlier this morning which was 9.2    Review of Systems    Constitutional: Positive for fatigue.  HENT: Negative.   Eyes: Negative.   Respiratory: Negative.   Cardiovascular: Negative.   Gastrointestinal: Negative.   Genitourinary: Negative.   Musculoskeletal: Negative.   Skin: Negative.   Neurological: Positive for weakness.  Hematological: Negative.        Objective:   Physical Exam Well-developed elderly white female in no acute distress, pleasant, alert and oriented x3 HEENT nontraumatic normocephalic EOMI PERRLA sclera anicteric ,Neck;;; Supple no JVD, Cardiovascular; regular rate and rhythm with S1-S2 no murmur rub or gallop  Pulmonary; clear bilaterally  Abdomen ;soft nontender nondistended bowel sounds active no palpable mass or hepatosplenomegaly  Rectal; exam not done at  Psych; mood and affect normal an appropriate.        Assessment & Plan:  #54 77 year old female with recent admission for upper GI bleeding found secondary to a proximal small bowe l Dieulafoy  versus AVM, which was treated with injection, APC and Endoclipping-Patient is stable with no further evidence of bleeding and hemoglobin is on the rise.  #2 prior history of acute GI bleed 2010 with negative workup  #3 diverticulosis  #4 incidental small gastric polyps  Plan; Continue Prevacid 30 mg by mouth daily Repeat CBC in 2 weeks Patient was given samples of Integra plus to take one daily x1 month. Patient advised to call for any evidence of recurrent bleeding.

## 2010-06-18 NOTE — Progress Notes (Signed)
Agree with assessment and plan 

## 2010-06-18 NOTE — Patient Instructions (Signed)
Come to our lab in 2 weeks, 07-02-2010. Take the samples of Iron capsules , 1 daily until the samples are gone. Continue the prevacid 1 capsule daily. Call us for any signs of recurrant bleeding.

## 2010-06-19 ENCOUNTER — Telehealth: Payer: Self-pay

## 2010-06-19 DIAGNOSIS — D649 Anemia, unspecified: Secondary | ICD-10-CM

## 2010-06-19 NOTE — Telephone Encounter (Signed)
Message copied by Michele Mcalpine on Thu Jun 19, 2010  3:22 PM ------      Message from: Hilarie Fredrickson      Created: Wed Jun 18, 2010  5:57 PM       Please let Capricia know that her blood count is stable at 9.2. Continue iron and repeat CBC in 4 weeks

## 2010-06-20 NOTE — Telephone Encounter (Signed)
Pt aware of results per Dr. Marina Goodell. She knows to continue iron and to repeat cbc in 4 weeks.

## 2010-07-02 ENCOUNTER — Other Ambulatory Visit (INDEPENDENT_AMBULATORY_CARE_PROVIDER_SITE_OTHER): Payer: Medicare Other

## 2010-07-02 DIAGNOSIS — D509 Iron deficiency anemia, unspecified: Secondary | ICD-10-CM

## 2010-07-02 LAB — CBC WITH DIFFERENTIAL/PLATELET
Basophils Relative: 0.6 % (ref 0.0–3.0)
Eosinophils Relative: 3.2 % (ref 0.0–5.0)
Hemoglobin: 9.9 g/dL — ABNORMAL LOW (ref 12.0–15.0)
Lymphocytes Relative: 31.8 % (ref 12.0–46.0)
Monocytes Relative: 9 % (ref 3.0–12.0)
Neutro Abs: 2.5 10*3/uL (ref 1.4–7.7)
RBC: 3.7 Mil/uL — ABNORMAL LOW (ref 3.87–5.11)
WBC: 4.4 10*3/uL — ABNORMAL LOW (ref 4.5–10.5)

## 2010-07-03 ENCOUNTER — Telehealth: Payer: Self-pay | Admitting: Physician Assistant

## 2010-07-03 NOTE — Progress Notes (Signed)
This encounter was created in error - please disregard.

## 2010-07-04 ENCOUNTER — Telehealth: Payer: Self-pay | Admitting: Internal Medicine

## 2010-07-04 NOTE — Telephone Encounter (Signed)
Pt calling wanting to know what her Hgb was on her last labs. Discussed with pt her Hgb. Pt aware.

## 2010-07-17 ENCOUNTER — Other Ambulatory Visit (INDEPENDENT_AMBULATORY_CARE_PROVIDER_SITE_OTHER): Payer: Medicare Other

## 2010-07-17 DIAGNOSIS — D649 Anemia, unspecified: Secondary | ICD-10-CM

## 2010-07-17 LAB — CBC WITH DIFFERENTIAL/PLATELET
Basophils Absolute: 0 10*3/uL (ref 0.0–0.1)
Basophils Relative: 0.6 % (ref 0.0–3.0)
Eosinophils Absolute: 0.2 10*3/uL (ref 0.0–0.7)
HCT: 35.1 % — ABNORMAL LOW (ref 36.0–46.0)
Hemoglobin: 11.7 g/dL — ABNORMAL LOW (ref 12.0–15.0)
Lymphs Abs: 1.7 10*3/uL (ref 0.7–4.0)
MCHC: 33.5 g/dL (ref 30.0–36.0)
Monocytes Relative: 9.6 % (ref 3.0–12.0)
Neutro Abs: 3.1 10*3/uL (ref 1.4–7.7)
RDW: 17.7 % — ABNORMAL HIGH (ref 11.5–14.6)

## 2010-07-18 ENCOUNTER — Other Ambulatory Visit: Payer: Self-pay | Admitting: Internal Medicine

## 2010-07-18 DIAGNOSIS — D649 Anemia, unspecified: Secondary | ICD-10-CM

## 2010-07-21 ENCOUNTER — Telehealth: Payer: Self-pay

## 2010-07-21 MED ORDER — INTEGRA PLUS PO CAPS: 1.0000 | ORAL_CAPSULE | ORAL | Status: DC

## 2010-07-21 NOTE — Telephone Encounter (Signed)
Message copied by Michele Mcalpine on Mon Jul 21, 2010  9:06 AM ------      Message from: Hilarie Fredrickson      Created: Fri Jul 18, 2010  9:00 AM       Let pt know hg is better. Stay on iron and repeat in 2 months

## 2010-07-21 NOTE — Telephone Encounter (Signed)
Pt aware of results per Dr. Perry. Pt knows to repeat labs in 2 months. 

## 2010-07-29 NOTE — Discharge Summary (Signed)
[name], [name]                 ACCOUNT NO.:  [id number]  MEDICAL RECORD NO.:  [id number]           PATIENT TYPE:  I  LOCATION:  1442                         FACILITY:  [facility]  PHYSICIAN:  Iva Boop, MD,FACGDATE OF BIRTH:  10-10-1926  DATE OF ADMISSION:  06/06/2010 DATE OF DISCHARGE:  06/11/2010                              DISCHARGE SUMMARY   ADMITTING DIAGNOSES: 1. An 77 year old female with recurrent gastrointestinal bleed,     probably upper, etiology unclear. 2. Anemia secondary to acute blood loss. 3. History of hypertension. 4. Gastroesophageal reflux disease. 5. Previous gastrointestinal bleed in 2010, etiology unclear, with     negative esophagogastroduodenoscopy, colonoscopy, and capsule     endoscopy.  DISCHARGE DIAGNOSES: 1. Stable status post recurrent acute/subacute gastrointestinal bleed     secondary to proximal jejunal bleeding lesion consistent with     either an AVM versus Dieulafoy lesion.  The patient is status post     epi injection, APC and Endo clipping on Jun 10, 2010. 2. Anemia secondary to acute blood loss. 3. Benign sessile gastric polyps. 4. History of hypertension.  CONSULTATIONS:  The patient initially admitted to the [facility] with GI consultation Dr. Stan Head  PROCEDURES:  Capsule endoscopy.  BRIEF HISTORY:  [name] is a pleasant generally healthy 77 year old white female with history as described above.  The patient has history of an acute GI bleed approximately 2 years ago at which time she had an endoscopy and colonoscopy as well as capsule endoscopy which were all unrevealing.  Her bleeding stopped and she had no further problems until 1-2 weeks prior to this admission.  She was seen and evaluated in the office and underwent endoscopy and colonoscopy by Dr. Marina Goodell on May 30, 2010.  She was found to have small sessile gastric polyps which were felt to be incidental and no evidence of any active  bleeding. Colonoscopy done on the same day  revealed moderate diverticulosis of the left colon and otherwise negative exam including normal appearing terminal ileum.  The patient was subsequently set up for capsule endoscopy.  However, she presented to the emergency room on the Jun 06, 2010 due to 2- to 3-day history of increased melena and complaints of weakness.  Hemoglobin was found to be 7.8 and she was admitted for transfusions and then further diagnostic workup.  She was seen in consultation by Dr. Christella Hartigan for GI and after transfusion she was scheduled for capsule endoscopy which was completed on the Jun 08, 2010. She was transfused 2 units of packed RBCs and had good response with hemoglobin up to 9.5.  The capsule endoscopy revealed evidence of blood oozing from the proximal small bowel, either the very distal duodenum or the proximal jejunum.  There was no lesions seen but clearly evidence of fresh blood oozing.  Remainder of the study was unremarkable.  She was then scheduled for enteroscopy and this was completed by Dr. Leone Payor on Jun 10, 2010.  There was finding of oozing of fresh blood in the proximal jejunum.  A small red spot was seen and after contact it  did bleed  more profusely.  This area was injected with epinephrine and then APC'ed and endoclipped with good control of the bleeding.  The patient tolerated the procedure well and has had no further evidence of active bleeding.  Her hemoglobin has drifted a bit since the transfusions but has remained stable over the past 36 hours at 8.9.  She is relatively asymptomatic, able to be up and about in the room without any shortness of breath, lightheadedness, etc.  She is allowed discharge to home on Jun 11, 2010, with instructions to avoid any aspirin or NSAIDs indefinitely.  She may advance to a regular diet and will follow up in the office on Jun 18, 2010, at 1:30 p.m. Amy Esterwood, PAC.  We will plan to check repeat CBC at  that time.  She is to call for any evidence of recurrent bleeding in the interim.  DISCHARGE MEDICATIONS:  Medications on discharge:  Multivitamin daily, Prevacid 30 mg p.o. daily and bisoprolol/hydrochlorothiazide 5/6.25 q.a.m.  CONDITION ON DISCHARGE:  Stable and improved.     Amy Esterwood, PA-C   ______________________________ Iva Boop, MD,FACG    AE/MEDQ  D:  06/11/2010  T:  06/11/2010  Job:  [id number]  Electronically Signed by AMY ESTERWOOD PA-C on 06/23/2010 01:27:16 PM Electronically Signed by Stan Head MDFACG on 07/29/2010 03:30:41 PM

## 2010-09-16 ENCOUNTER — Other Ambulatory Visit (INDEPENDENT_AMBULATORY_CARE_PROVIDER_SITE_OTHER): Payer: Medicare Other

## 2010-09-16 ENCOUNTER — Telehealth: Payer: Self-pay

## 2010-09-16 DIAGNOSIS — D649 Anemia, unspecified: Secondary | ICD-10-CM

## 2010-09-16 LAB — CBC WITH DIFFERENTIAL/PLATELET
Basophils Absolute: 0 10*3/uL (ref 0.0–0.1)
Eosinophils Absolute: 0.2 10*3/uL (ref 0.0–0.7)
HCT: 41.8 % (ref 36.0–46.0)
Lymphs Abs: 1.8 10*3/uL (ref 0.7–4.0)
MCHC: 34.1 g/dL (ref 30.0–36.0)
Monocytes Relative: 8.3 % (ref 3.0–12.0)
Platelets: 145 10*3/uL — ABNORMAL LOW (ref 150.0–400.0)
RDW: 18.9 % — ABNORMAL HIGH (ref 11.5–14.6)

## 2010-09-16 NOTE — Telephone Encounter (Signed)
Message left for pt regarding results per Dr. Perry. 

## 2010-09-16 NOTE — Telephone Encounter (Signed)
Message copied by Michele Mcalpine on Tue Sep 16, 2010 12:38 PM ------      Message from: Hilarie Fredrickson      Created: Tue Sep 16, 2010 11:40 AM       Let pt know that Hg is normal . Stay on iron indefinitely. Send copy of lab to her PCP

## 2010-09-19 NOTE — Discharge Summary (Signed)
[name], [name]                 ACCOUNT NO.:  [id number]  MEDICAL RECORD NO.:  [id number]  LOCATION:  1518                         FACILITY:  [facility]  PHYSICIAN:  Wilhemina Bonito. Marina Goodell, MD      DATE OF BIRTH:  10-09-1926  DATE OF ADMISSION:  05/30/2010 DATE OF DISCHARGE:  05/31/2010                              DISCHARGE SUMMARY   ADMITTING DIAGNOSES: 36. 77 year old female with recurrent slow gastrointestinal bleeding,     presenting with melena, etiology not clear.  Patient with history     of gastric polyps and diverticular disease. 2. Mild anemia secondary to above. 3. History of gastroesophageal reflux disease. 4. History of hypertension. 5. Osteoarthritis.  DISCHARGE DIAGNOSES: 12. 77 year old female with recurrent slow gastrointestinal bleeding,     presenting with melena with endoscopy and colonoscopy this     admission, unremarkable with no source for bleeding found. 2. Mild anemia secondary to above, stable. 3. History of gastroesophageal reflux disease. 4. History of hypertension. 5. Osteoarthritis.  CONSULTATIONS:  None.  PROCEDURES:  Upper endoscopy and colonoscopy per Dr. Marina Goodell on May 30, 2010.  BRIEF HISTORY:  [name] is a very nice 77 year old female who was seen in the office the day prior to admission, that time with 3-day history of melena, having 1 to 2 bowel movements per day.  She had no associated complaints of pain, cramping, nausea, vomiting, weakness, etc.  Her hemoglobin was noted to be 12.6 on May 28, 2010.  Her stool was black and Hemoccult positive.  She was scheduled for outpatient colonoscopy and endoscopy on May 11 with Dr. Marina Goodell.  However after completing her prep, she called the office because she was still having black stools.  She also stated that she passed a small amount of dark red blood in the commode.  For that reason, it was felt that she may require admission and/or transfusion and she was sent to [facility] for possible admission and  then inpatient procedures.  The patient does have history of prior EGD with Dr. Elnoria Howard, June 2010, which showed gastric polyps which were not removed and colonoscopy at that same time showing scattered diverticulosis and small internal hemorrhoids.  Capsule endoscopy was negative.  She had had no recurrent interval bleeding in the past couple of years until 3 days prior to this admission.  LABORATORY STUDIES:  On May 11, WBC of 5.4, hemoglobin 11.5, hematocrit of 33.3.  Followup later that day, hemoglobin 9.9, hematocrit of 28.7 and followup on the May 12, hemoglobin 10.5, hematocrit of 29.9, platelets low at 141,000.  X-ray studies, none.  PROCEDURES:  Colonoscopy per Dr. Marina Goodell showed normal terminal ileum, moderate diverticulosis in the left colon.  No active bleeding and otherwise normal exam.  She did have small internal hemorrhoids. Endoscopy was also performed and was normal with small incidental polyps in the body of stomach.  HOSPITAL COURSE:  The patient was admitted to the service of Dr. Marina Goodell on May 11.  She was placed on a clear liquid diet, IV fluids, serial H and H.  On admission, she was very hemodynamically stable.  Her hemoglobin was a bit lower in 11 range.  We arranged  for her to undergo colonoscopy and endoscopy the same day with Dr. Marina Goodell and she tolerated the procedures without difficulty.  Results as outlined above.  She was kept overnight because of the sedation, age and living alone and was allowed discharge to home the following morning in a stable condition. He was to follow up with Dr. Marina Goodell in the office in 2 weeks and call for any problems with recurrent melena.  MEDICATIONS ON DISCHARGE: 1. Prevacid 30 mg p.o. daily. 2. Vitamin daily. 3. Bisoprolol/HCTZ 5/6.25 mg 1 p.o. each evening.     Amy Esterwood, PA-C   ______________________________ Wilhemina Bonito. Marina Goodell, MD    AE/MEDQ  D:  07/15/2010  T:  07/15/2010  Job:  [id number]  Electronically Signed  by AMY ESTERWOOD PA-C on 07/31/2010 02:47:52 PM Electronically Signed by Yancey Flemings MD on 09/19/2010 11:05:38 AM

## 2010-11-06 ENCOUNTER — Telehealth: Payer: Self-pay | Admitting: Internal Medicine

## 2010-11-06 MED ORDER — TRIAMCINOLONE ACETONIDE 0.5 % EX OINT: TOPICAL_OINTMENT | Freq: Two times a day (BID) | CUTANEOUS | Status: DC

## 2010-11-06 NOTE — Telephone Encounter (Signed)
Please send a new rx for her Triamcinolone 0.5% in [facility]. She doesn't use [facility] anymore. Thanks.

## 2010-11-06 NOTE — Telephone Encounter (Signed)
done

## 2010-11-11 ENCOUNTER — Encounter: Payer: Self-pay | Admitting: Internal Medicine

## 2010-11-11 ENCOUNTER — Ambulatory Visit (INDEPENDENT_AMBULATORY_CARE_PROVIDER_SITE_OTHER): Payer: Medicare Other | Admitting: Internal Medicine

## 2010-11-11 DIAGNOSIS — I10 Essential (primary) hypertension: Secondary | ICD-10-CM

## 2010-11-11 DIAGNOSIS — M1990 Unspecified osteoarthritis, unspecified site: Secondary | ICD-10-CM

## 2010-11-11 DIAGNOSIS — K922 Gastrointestinal hemorrhage, unspecified: Secondary | ICD-10-CM

## 2010-11-11 NOTE — Patient Instructions (Signed)
Limit your sodium (Salt) intake    It is important that you exercise regularly, at least 20 minutes 3 to 4 times per week.  If you develop chest pain or shortness of breath seek  medical attention. 

## 2010-11-11 NOTE — Progress Notes (Signed)
  Subjective:    Patient ID: [name], female    DOB: 02/13/1926, 77 y.o.   MRN: [id number]  HPI  77 year old patient who is seen today for followup. She has a history of treated hypertension. Since her last visit here she has been hospitalized for upper GI bleeding. This apparently was related to a small bowel AVM that was treated. Her last hemoglobin was normal approximately 6 weeks ago. She feels quite well today. She does have a history of osteoarthritis but does not take any anti-inflammatory medications. She is doing quite well. No concerns or complaints no further melena she takes iron sporadically only    Review of Systems  Constitutional: Negative.   HENT: Negative for hearing loss, congestion, sore throat, rhinorrhea, dental problem, sinus pressure and tinnitus.   Eyes: Negative for pain, discharge and visual disturbance.  Respiratory: Negative for cough and shortness of breath.   Cardiovascular: Negative for chest pain, palpitations and leg swelling.  Gastrointestinal: Negative for nausea, vomiting, abdominal pain, diarrhea, constipation, blood in stool and abdominal distention.  Genitourinary: Negative for dysuria, urgency, frequency, hematuria, flank pain, vaginal bleeding, vaginal discharge, difficulty urinating, vaginal pain and pelvic pain.  Musculoskeletal: Negative for joint swelling, arthralgias and gait problem.  Skin: Negative for rash.  Neurological: Negative for dizziness, syncope, speech difficulty, weakness, numbness and headaches.  Hematological: Negative for adenopathy.  Psychiatric/Behavioral: Negative for behavioral problems, dysphoric mood and agitation. The patient is not nervous/anxious.        Objective:   Physical Exam  Constitutional: She is oriented to person, place, and time. She appears well-developed and well-nourished.  HENT:  Head: Normocephalic.  Right Ear: External ear normal.  Left Ear: External ear normal.  Mouth/Throat: Oropharynx is  clear and moist.  Eyes: Conjunctivae and EOM are normal. Pupils are equal, round, and reactive to light.  Neck: Normal range of motion. Neck supple. No thyromegaly present.  Cardiovascular: Normal rate, regular rhythm, normal heart sounds and intact distal pulses.   Pulmonary/Chest: Effort normal and breath sounds normal.  Abdominal: Soft. Bowel sounds are normal. She exhibits no mass. There is no tenderness.  Musculoskeletal: Normal range of motion.  Lymphadenopathy:    She has no cervical adenopathy.  Neurological: She is alert and oriented to person, place, and time.  Skin: Skin is warm and dry. No rash noted.  Psychiatric: She has a normal mood and affect. Her behavior is normal.          Assessment & Plan:    Hypertension well controlled. Will continue present blood pressure regimen History of upper GI bleed and acute blood loss anemia. Stable Osteoarthritis. Gastroesophageal reflux disease. We'll continue PPI  Will recheck in 7 months for her annual exam

## 2010-11-25 NOTE — Telephone Encounter (Signed)
done

## 2011-02-28 ENCOUNTER — Other Ambulatory Visit: Payer: Self-pay | Admitting: Internal Medicine

## 2011-03-31 ENCOUNTER — Encounter: Payer: Self-pay | Admitting: Internal Medicine

## 2011-05-06 ENCOUNTER — Other Ambulatory Visit: Payer: Self-pay | Admitting: Internal Medicine

## 2011-05-29 ENCOUNTER — Encounter: Payer: Medicare Other | Admitting: Internal Medicine

## 2011-06-29 ENCOUNTER — Ambulatory Visit (INDEPENDENT_AMBULATORY_CARE_PROVIDER_SITE_OTHER): Payer: Medicare Other | Admitting: Internal Medicine

## 2011-06-29 ENCOUNTER — Encounter: Payer: Self-pay | Admitting: Internal Medicine

## 2011-06-29 VITALS — BP 124/80 | HR 72 | Temp 98.1°F | Resp 18 | Ht 63.0 in | Wt 165.0 lb

## 2011-06-29 DIAGNOSIS — E785 Hyperlipidemia, unspecified: Secondary | ICD-10-CM

## 2011-06-29 DIAGNOSIS — K219 Gastro-esophageal reflux disease without esophagitis: Secondary | ICD-10-CM

## 2011-06-29 DIAGNOSIS — Z0000 Encounter for general adult medical examination without abnormal findings: Secondary | ICD-10-CM

## 2011-06-29 DIAGNOSIS — K922 Gastrointestinal hemorrhage, unspecified: Secondary | ICD-10-CM

## 2011-06-29 DIAGNOSIS — I10 Essential (primary) hypertension: Secondary | ICD-10-CM

## 2011-06-29 LAB — CBC WITH DIFFERENTIAL/PLATELET
Basophils Absolute: 0 10*3/uL (ref 0.0–0.1)
Eosinophils Relative: 4 % (ref 0.0–5.0)
Lymphocytes Relative: 33.5 % (ref 12.0–46.0)
Monocytes Relative: 8.9 % (ref 3.0–12.0)
Neutrophils Relative %: 52.8 % (ref 43.0–77.0)
Platelets: 123 10*3/uL — ABNORMAL LOW (ref 150.0–400.0)
RDW: 14.2 % (ref 11.5–14.6)
WBC: 4.5 10*3/uL (ref 4.5–10.5)

## 2011-06-29 LAB — COMPREHENSIVE METABOLIC PANEL
ALT: 53 U/L — ABNORMAL HIGH (ref 0–35)
Albumin: 4.2 g/dL (ref 3.5–5.2)
CO2: 27 mEq/L (ref 19–32)
Calcium: 9.2 mg/dL (ref 8.4–10.5)
Chloride: 105 mEq/L (ref 96–112)
GFR: 71.73 mL/min (ref >60.00)
Glucose, Bld: 93 mg/dL (ref 70–99)
Potassium: 4.2 mEq/L (ref 3.5–5.1)
Sodium: 142 mEq/L (ref 135–145)
Total Bilirubin: 0.8 mg/dL (ref 0.3–1.2)
Total Protein: 7 g/dL (ref 6.0–8.3)

## 2011-06-29 LAB — TSH: TSH: 3.46 u[IU]/mL (ref 0.35–5.50)

## 2011-06-29 LAB — LIPID PANEL: HDL: 35.1 mg/dL — ABNORMAL LOW (ref >39.00)

## 2011-06-29 MED ORDER — LANSOPRAZOLE 30 MG PO CPDR: 30.0000 mg | DELAYED_RELEASE_CAPSULE | Freq: Every day | ORAL | Status: DC

## 2011-06-29 MED ORDER — BISOPROLOL-HYDROCHLOROTHIAZIDE 5-6.25 MG PO TABS: 1.0000 | ORAL_TABLET | Freq: Every day | ORAL | Status: DC

## 2011-06-29 MED ORDER — TRIAMCINOLONE ACETONIDE 0.5 % EX OINT: TOPICAL_OINTMENT | Freq: Two times a day (BID) | CUTANEOUS | Status: DC

## 2011-06-29 NOTE — Progress Notes (Signed)
Patient ID: [name], female   DOB: 10-Aug-1926, 78 y.o.   MRN: [id number]  Subjective:    Patient ID: [name], female    DOB: 04-08-1926, 78 y.o.   MRN: [id number]  HPI  78 year-old patient who is seen today for her annual health assessment. She has a history of GI bleeding and was hospitalized one year ago and. She also had a complete GI evaluation in 2010 that included capsule endoscopy due to GI bleeding. She required outpatient blood transfusions at that time. She has been followed by [facility]. She has a history of hypertension which has been stable she has gastroesophageal reflux disease diverticulosis and history of colonic polyps.  1. Risk factors, based on past  M,S,F history- cardiovascular risk factors include a history of hypertension as well as age  82.  Physical activities: No activity restrictions. Lives independently  3.  Depression/mood: No history of depression or mood disorder  4.  Hearing: No deficits  5.  ADL's: Independent in all aspect of daily living  6.  Fall risk: Low  7.  Home safety: No problems identified  8.  Height weight, and visual acuity; height and weight stable. No change in visual acuity  9.  Counseling: Will need GI followup tomorrow. Will need close followup with H&H  10. Lab orders based on risk factors: Complete laboratory screen including TSH and hematologic studies will be reviewed  11. Referral :  Will be seen by [facility] this week  12. Care plan: Heart healthy diet regular exercise all encouraged  13. Cognitive assessment: Alert and oriented with normal affect. No cognitive dysfunction    Allergies:  No Known Drug Allergies   Past History:  Past Medical History:  Reviewed history from 08/02/2006 and no changes required.  Colonic polyps, hx of  GERD  Hypertension  Osteoarthritis  Hyperthyroidism, S/P 131  Diverticulosis, colon  History of upper GI bleeding x2 probably secondary to duodenal AVM   Past Surgical  History:  Appendectomy  Hysterectomy 1993  colonoscopy 2008, 2010  2012 EGD 2010 2012  Family History:  Reviewed history from 04/12/2007 and no changes required.  father died age 51. Hemorrhagic stroke  mother died age 61, pancreatic cancer  One brother died of an MI at age 52  one sister died age 25. Complications of acute rheumatic fever  one sister died of complications following a right carotid endarterectomy     Review of Systems  Constitutional: Negative for fever, appetite change, fatigue and unexpected weight change.  HENT: Negative for hearing loss, ear pain, nosebleeds, congestion, sore throat, mouth sores, trouble swallowing, neck stiffness, dental problem, voice change, sinus pressure and tinnitus.   Eyes: Negative for photophobia, pain, redness and visual disturbance.  Respiratory: Negative for cough, chest tightness and shortness of breath.   Cardiovascular: Negative for chest pain, palpitations and leg swelling.  Gastrointestinal: Negative for nausea, vomiting, abdominal pain, diarrhea, constipation, blood in stool, abdominal distention and rectal pain.  Genitourinary: Negative for dysuria, urgency, frequency, hematuria, flank pain, vaginal bleeding, vaginal discharge, difficulty urinating, genital sores, vaginal pain, menstrual problem and pelvic pain.  Musculoskeletal: Negative for back pain and arthralgias.  Skin: Negative for rash.  Neurological: Negative for dizziness, syncope, speech difficulty, weakness, light-headedness, numbness and headaches.  Hematological: Negative for adenopathy. Does not bruise/bleed easily.  Psychiatric/Behavioral: Negative for suicidal ideas, behavioral problems, self-injury, dysphoric mood and agitation. The patient is not nervous/anxious.        Objective:  Physical Exam  Constitutional: She is oriented to person, place, and time. She appears well-developed and well-nourished.  HENT:  Head: Normocephalic and atraumatic.  Right  Ear: External ear normal.  Left Ear: External ear normal.  Mouth/Throat: Oropharynx is clear and moist.  Eyes: Conjunctivae and EOM are normal.  Neck: Normal range of motion. Neck supple. No JVD present. No thyromegaly present.  Cardiovascular: Normal rate, regular rhythm, normal heart sounds and intact distal pulses.   No murmur heard.      Dorsalis pedis pulses faint posterior tibial pulses full  Pulmonary/Chest: Effort normal and breath sounds normal. She has no wheezes. She has no rales.  Abdominal: Soft. Bowel sounds are normal. She exhibits no distension and no mass. There is no tenderness. There is no rebound and no guarding.  Genitourinary: Vagina normal. Guaiac positive stool.       Melanotic heme positive stool  Musculoskeletal: Normal range of motion. She exhibits no edema and no tenderness.  Neurological: She is alert and oriented to person, place, and time. She has normal reflexes. No cranial nerve deficit. She exhibits normal muscle tone. Coordination normal.  Skin: Skin is warm and dry. Rash noted.       Psoriatic skin lesions in the periumbilical area as well as the back region  Psychiatric: She has a normal mood and affect. Her behavior is normal.          Assessment & Plan:   History of recurrent upper GI bleeding. We'll check a CBC. Continue daily iron Hypertension stable Annual health assessment  We'll check lab studies today including CBC.  Exercise modest weight loss encouraged Low-salt diet recommended  Recheck 6 months

## 2011-06-29 NOTE — Patient Instructions (Signed)

## 2011-09-03 ENCOUNTER — Telehealth: Payer: Self-pay | Admitting: Internal Medicine

## 2011-09-03 NOTE — Telephone Encounter (Signed)
Please advise 

## 2011-09-03 NOTE — Telephone Encounter (Signed)
Pt called and has cataracts on her eyes. Pt is req a recommendation to a doctor to get these removed.

## 2011-09-06 NOTE — Telephone Encounter (Signed)
Dr Groat 

## 2011-09-07 NOTE — Telephone Encounter (Signed)
Spoke with pt- informed of dr. kwiatkowski's reccomendation 

## 2011-09-24 ENCOUNTER — Other Ambulatory Visit: Payer: Self-pay | Admitting: Ophthalmology

## 2012-04-26 ENCOUNTER — Encounter: Payer: Self-pay | Admitting: Internal Medicine

## 2012-10-03 ENCOUNTER — Other Ambulatory Visit: Payer: Self-pay | Admitting: Internal Medicine

## 2012-12-27 ENCOUNTER — Other Ambulatory Visit: Payer: Self-pay | Admitting: Internal Medicine

## 2013-01-06 ENCOUNTER — Encounter: Payer: Medicare Other | Admitting: Internal Medicine

## 2013-01-11 ENCOUNTER — Encounter: Payer: Medicare Other | Admitting: Internal Medicine

## 2013-02-17 ENCOUNTER — Encounter: Payer: Medicare Other | Admitting: Internal Medicine

## 2013-03-01 ENCOUNTER — Ambulatory Visit (INDEPENDENT_AMBULATORY_CARE_PROVIDER_SITE_OTHER): Payer: Medicare Other | Admitting: Internal Medicine

## 2013-03-01 ENCOUNTER — Encounter: Payer: Self-pay | Admitting: Internal Medicine

## 2013-03-01 VITALS — BP 120/80 | HR 72 | Temp 97.9°F | Resp 20 | Ht 62.25 in | Wt 164.0 lb

## 2013-03-01 DIAGNOSIS — E785 Hyperlipidemia, unspecified: Secondary | ICD-10-CM

## 2013-03-01 DIAGNOSIS — Z0000 Encounter for general adult medical examination without abnormal findings: Secondary | ICD-10-CM

## 2013-03-01 DIAGNOSIS — I10 Essential (primary) hypertension: Secondary | ICD-10-CM

## 2013-03-01 DIAGNOSIS — L408 Other psoriasis: Secondary | ICD-10-CM

## 2013-03-01 DIAGNOSIS — M1990 Unspecified osteoarthritis, unspecified site: Secondary | ICD-10-CM

## 2013-03-01 DIAGNOSIS — K219 Gastro-esophageal reflux disease without esophagitis: Secondary | ICD-10-CM

## 2013-03-01 DIAGNOSIS — K5730 Diverticulosis of large intestine without perforation or abscess without bleeding: Secondary | ICD-10-CM

## 2013-03-01 DIAGNOSIS — E0590 Thyrotoxicosis, unspecified without thyrotoxic crisis or storm: Secondary | ICD-10-CM

## 2013-03-01 DIAGNOSIS — L409 Psoriasis, unspecified: Secondary | ICD-10-CM

## 2013-03-01 LAB — TSH: TSH: 3.16 u[IU]/mL (ref 0.35–5.50)

## 2013-03-01 LAB — CBC WITH DIFFERENTIAL/PLATELET
BASOS PCT: 0.6 % (ref 0.0–3.0)
Basophils Absolute: 0 10*3/uL (ref 0.0–0.1)
EOS PCT: 4.2 % (ref 0.0–5.0)
Eosinophils Absolute: 0.3 10*3/uL (ref 0.0–0.7)
HCT: 43.8 % (ref 36.0–46.0)
Hemoglobin: 14.7 g/dL (ref 12.0–15.0)
Lymphocytes Relative: 32.2 % (ref 12.0–46.0)
Lymphs Abs: 2.1 10*3/uL (ref 0.7–4.0)
MCHC: 33.6 g/dL (ref 30.0–36.0)
MCV: 91.8 fl (ref 78.0–100.0)
MONOS PCT: 6.6 % (ref 3.0–12.0)
Monocytes Absolute: 0.4 10*3/uL (ref 0.1–1.0)
NEUTROS PCT: 56.4 % (ref 43.0–77.0)
Neutro Abs: 3.6 10*3/uL (ref 1.4–7.7)
PLATELETS: 116 10*3/uL — AB (ref 150.0–400.0)
RBC: 4.78 Mil/uL (ref 3.87–5.11)
RDW: 14.6 % (ref 11.5–14.6)
WBC: 6.4 10*3/uL (ref 4.5–10.5)

## 2013-03-01 LAB — LIPID PANEL
CHOL/HDL RATIO: 5
Cholesterol: 156 mg/dL (ref 0–200)
HDL: 33.2 mg/dL — ABNORMAL LOW (ref >39.00)
LDL CALC: 89 mg/dL (ref 0–99)
TRIGLYCERIDES: 167 mg/dL — AB (ref 0.0–149.0)
VLDL: 33.4 mg/dL (ref 0.0–40.0)

## 2013-03-01 LAB — COMPREHENSIVE METABOLIC PANEL
ALBUMIN: 4.2 g/dL (ref 3.5–5.2)
ALK PHOS: 51 U/L (ref 39–117)
ALT: 73 U/L — ABNORMAL HIGH (ref 0–35)
AST: 83 U/L — ABNORMAL HIGH (ref 0–37)
BILIRUBIN TOTAL: 1.1 mg/dL (ref 0.3–1.2)
BUN: 12 mg/dL (ref 6–23)
CHLORIDE: 105 meq/L (ref 96–112)
CO2: 27 mEq/L (ref 19–32)
Calcium: 9.5 mg/dL (ref 8.4–10.5)
Creatinine, Ser: 0.8 mg/dL (ref 0.4–1.2)
GFR: 68.5 mL/min (ref >60.00)
GLUCOSE: 90 mg/dL (ref 70–99)
POTASSIUM: 4 meq/L (ref 3.5–5.1)
Sodium: 141 mEq/L (ref 135–145)
TOTAL PROTEIN: 7.3 g/dL (ref 6.0–8.3)

## 2013-03-01 MED ORDER — PREVACID 30 MG PO CPDR: DELAYED_RELEASE_CAPSULE | ORAL | Status: DC

## 2013-03-01 MED ORDER — BISOPROLOL-HYDROCHLOROTHIAZIDE 5-6.25 MG PO TABS: ORAL_TABLET | ORAL | Status: DC

## 2013-03-01 MED ORDER — TRIAMCINOLONE ACETONIDE 0.5 % EX OINT: TOPICAL_OINTMENT | Freq: Two times a day (BID) | CUTANEOUS | Status: DC

## 2013-03-01 NOTE — Patient Instructions (Addendum)
Limit your sodium (Salt) intake    It is important that you exercise regularly, at least 20 minutes 3 to 4 times per week.  If you develop chest pain or shortness of breath seek  medical attention.  Return in 6 months for follow-up  Please check your blood pressure on a regular basis.  If it is consistently greater than 150/90, please make an office appointment.

## 2013-03-01 NOTE — Progress Notes (Signed)
Pre-visit discussion using our clinic review tool. No additional management support is needed unless otherwise documented below in the visit note.  

## 2013-03-01 NOTE — Progress Notes (Signed)
Patient ID: [name], female   DOB: 08-22-1926, 80 y.o.   MRN: [id number]  Subjective:    Patient ID: [name], female    DOB: 11-Oct-1926, 80 y.o.   MRN: [id number]  HPI   80year-old patient who is seen today for her annual health assessment. She has a history of GI bleeding and was hospitalized one year ago and. She also had a complete GI evaluation in 2010 that included capsule endoscopy due to GI bleeding. She required outpatient blood transfusions at that time. She has been followed by [facility].  She has a history of hypertension which has been stable she has gastroesophageal reflux disease diverticulosis and history of colonic polyps.  1. Risk factors, based on past  M,S,F history- cardiovascular risk factors include a history of hypertension as well as age  69.  Physical activities: No activity restrictions. Lives independently  3.  Depression/mood: No history of depression or mood disorder  4.  Hearing: No deficits  5.  ADL's: Independent in all aspect of daily living  6.  Fall risk: Low  7.  Home safety: No problems identified  8.  Height weight, and visual acuity; height and weight stable. No change in visual acuity  9.  Counseling: Will need GI followup tomorrow. Will need close followup with H&H  10. Lab orders based on risk factors: Complete laboratory screen including TSH and hematologic studies will be reviewed  11. Referral :   not appropriate at this time  12. Care plan: Heart healthy diet regular exercise all encouraged  13. Cognitive assessment: Alert and oriented with normal affect. No cognitive dysfunction    Allergies:  No Known Drug Allergies   Past History:  Past Medical History:  Reviewed history from 08/02/2006 and no changes required.  Colonic polyps, hx of  GERD  Hypertension  Osteoarthritis  Hyperthyroidism, S/P 131  Diverticulosis, colon  History of upper GI bleeding x2 probably secondary to duodenal AVM   Past Surgical History:   Appendectomy  Hysterectomy 1993  colonoscopy 2008, 2010  2012 EGD 2010 2012  Family History:   father died age 70. Hemorrhagic stroke  mother died age 34, pancreatic cancer  One brother died of an MI at age 23  one sister died age 68. Complications of acute rheumatic fever  one sister died of complications following a right carotid endarterectomy     Review of Systems  Constitutional: Negative for fever, appetite change, fatigue and unexpected weight change.  HENT: Negative for congestion, dental problem, ear pain, hearing loss, mouth sores, nosebleeds, sinus pressure, sore throat, tinnitus, trouble swallowing and voice change.   Eyes: Negative for photophobia, pain, redness and visual disturbance.  Respiratory: Negative for cough, chest tightness and shortness of breath.   Cardiovascular: Negative for chest pain, palpitations and leg swelling.  Gastrointestinal: Negative for nausea, vomiting, abdominal pain, diarrhea, constipation, blood in stool, abdominal distention and rectal pain.  Genitourinary: Negative for dysuria, urgency, frequency, hematuria, flank pain, vaginal bleeding, vaginal discharge, difficulty urinating, genital sores, vaginal pain, menstrual problem and pelvic pain.  Musculoskeletal: Negative for arthralgias, back pain and neck stiffness.  Skin: Negative for rash.  Neurological: Negative for dizziness, syncope, speech difficulty, weakness, light-headedness, numbness and headaches.  Hematological: Negative for adenopathy. Does not bruise/bleed easily.  Psychiatric/Behavioral: Negative for suicidal ideas, behavioral problems, self-injury, dysphoric mood and agitation. The patient is not nervous/anxious.        Objective:   Physical Exam  Constitutional: She is oriented to  person, place, and time. She appears well-developed and well-nourished.  HENT:  Head: Normocephalic and atraumatic.  Right Ear: External ear normal.  Left Ear: External ear normal.   Mouth/Throat: Oropharynx is clear and moist.  Eyes: Conjunctivae and EOM are normal.  Neck: Normal range of motion. Neck supple. No JVD present. No thyromegaly present.  Cardiovascular: Normal rate, regular rhythm, normal heart sounds and intact distal pulses.   No murmur heard. Dorsalis pedis pulses faint posterior tibial pulses full  Pulmonary/Chest: Effort normal and breath sounds normal. She has no wheezes. She has no rales.  Abdominal: Soft. Bowel sounds are normal. She exhibits no distension and no mass. There is no tenderness. There is no rebound and no guarding.  Musculoskeletal: Normal range of motion. She exhibits no edema and no tenderness.  Neurological: She is alert and oriented to person, place, and time. She has normal reflexes. No cranial nerve deficit. She exhibits normal muscle tone. Coordination normal.  Skin: Skin is warm and dry. Rash noted.  Psoriatic skin lesions in the periumbilical area as well as the back region  Dry flaky dermatitis distal to the knees  Psychiatric: She has a normal mood and affect. Her behavior is normal.          Assessment & Plan:   History of recurrent upper GI bleeding. We'll check a CBC. Hypertension stable Annual health assessment  We'll check lab studies today including CBC.  Exercise modest weight loss encouraged Low-salt diet recommended  Recheck 6 months

## 2013-03-02 ENCOUNTER — Telehealth: Payer: Self-pay | Admitting: Internal Medicine

## 2013-03-02 NOTE — Telephone Encounter (Signed)
Relevant patient education mailed to patient.  

## 2014-03-07 ENCOUNTER — Other Ambulatory Visit: Payer: Self-pay | Admitting: Internal Medicine

## 2014-04-20 ENCOUNTER — Other Ambulatory Visit: Payer: Self-pay | Admitting: Internal Medicine

## 2015-03-19 ENCOUNTER — Other Ambulatory Visit: Payer: Self-pay | Admitting: Internal Medicine

## 2017-12-17 ENCOUNTER — Encounter (HOSPITAL_COMMUNITY): Payer: Self-pay

## 2017-12-17 ENCOUNTER — Emergency Department (HOSPITAL_COMMUNITY)
Admission: EM | Admit: 2017-12-17 | Discharge: 2017-12-17 | Disposition: A | Discharge status: Home / Self Care | Payer: Medicare Other | Attending: Emergency Medicine | Admitting: Emergency Medicine

## 2017-12-17 ENCOUNTER — Other Ambulatory Visit: Payer: Self-pay

## 2017-12-17 ENCOUNTER — Emergency Department (HOSPITAL_COMMUNITY): Payer: Medicare Other

## 2017-12-17 DIAGNOSIS — S4991XA Unspecified injury of right shoulder and upper arm, initial encounter: Secondary | ICD-10-CM | POA: Diagnosis not present

## 2017-12-17 DIAGNOSIS — R29898 Other symptoms and signs involving the musculoskeletal system: Secondary | ICD-10-CM | POA: Diagnosis not present

## 2017-12-17 DIAGNOSIS — S8992XA Unspecified injury of left lower leg, initial encounter: Secondary | ICD-10-CM | POA: Diagnosis not present

## 2017-12-17 DIAGNOSIS — R220 Localized swelling, mass and lump, head: Secondary | ICD-10-CM | POA: Diagnosis not present

## 2017-12-17 DIAGNOSIS — R51 Headache: Secondary | ICD-10-CM | POA: Diagnosis not present

## 2017-12-17 DIAGNOSIS — M79601 Pain in right arm: Secondary | ICD-10-CM | POA: Insufficient documentation

## 2017-12-17 DIAGNOSIS — R531 Weakness: Secondary | ICD-10-CM | POA: Diagnosis not present

## 2017-12-17 DIAGNOSIS — R296 Repeated falls: Secondary | ICD-10-CM

## 2017-12-17 DIAGNOSIS — M25562 Pain in left knee: Secondary | ICD-10-CM | POA: Insufficient documentation

## 2017-12-17 DIAGNOSIS — I10 Essential (primary) hypertension: Secondary | ICD-10-CM | POA: Diagnosis not present

## 2017-12-17 DIAGNOSIS — L409 Psoriasis, unspecified: Secondary | ICD-10-CM | POA: Diagnosis not present

## 2017-12-17 DIAGNOSIS — R269 Unspecified abnormalities of gait and mobility: Secondary | ICD-10-CM | POA: Diagnosis not present

## 2017-12-17 LAB — CBC WITH DIFFERENTIAL/PLATELET
Abs Immature Granulocytes: 0.02 10*3/uL (ref 0.00–0.07)
Basophils Absolute: 0 10*3/uL (ref 0.0–0.1)
Basophils Relative: 0 %
EOS PCT: 2 %
Eosinophils Absolute: 0.1 10*3/uL (ref 0.0–0.5)
HCT: 36.1 % (ref 36.0–46.0)
Hemoglobin: 12.5 g/dL (ref 12.0–15.0)
Immature Granulocytes: 0 %
Lymphocytes Relative: 22 %
Lymphs Abs: 1.2 10*3/uL (ref 0.7–4.0)
MCH: 29.8 pg (ref 26.0–34.0)
MCHC: 34.6 g/dL (ref 30.0–36.0)
MCV: 86 fL (ref 80.0–100.0)
Monocytes Absolute: 0.5 10*3/uL (ref 0.1–1.0)
Monocytes Relative: 10 %
Neutro Abs: 3.6 10*3/uL (ref 1.7–7.7)
Neutrophils Relative %: 66 %
Platelets: DECREASED 10*3/uL (ref 150–400)
RBC: 4.2 MIL/uL (ref 3.87–5.11)
RDW: 13.9 % (ref 11.5–15.5)
WBC: 5.5 10*3/uL (ref 4.0–10.5)
nRBC: 0 % (ref 0.0–0.2)

## 2017-12-17 LAB — COMPREHENSIVE METABOLIC PANEL
ALT: 22 U/L (ref 0–44)
AST: 28 U/L (ref 15–41)
Albumin: 3.8 g/dL (ref 3.5–5.0)
Alkaline Phosphatase: 55 U/L (ref 38–126)
Anion gap: 8 (ref 5–15)
BUN: 12 mg/dL (ref 8–23)
CO2: 25 mmol/L (ref 22–32)
CREATININE: 1.17 mg/dL — AB (ref 0.44–1.00)
Calcium: 9.3 mg/dL (ref 8.9–10.3)
Chloride: 106 mmol/L (ref 98–111)
GFR calc Af Amer: 48 mL/min — ABNORMAL LOW (ref >60)
GFR calc non Af Amer: 41 mL/min — ABNORMAL LOW (ref >60)
Glucose, Bld: 106 mg/dL — ABNORMAL HIGH (ref 70–99)
Potassium: 3.5 mmol/L (ref 3.5–5.1)
Sodium: 139 mmol/L (ref 135–145)
Total Bilirubin: 0.8 mg/dL (ref 0.3–1.2)
Total Protein: 6.5 g/dL (ref 6.5–8.1)

## 2017-12-17 MED ORDER — TRIAMCINOLONE ACETONIDE 0.1 % EX CREA: 1.0000 "application " | TOPICAL_CREAM | Freq: Two times a day (BID) | CUTANEOUS | 0 refills | Status: DC

## 2017-12-17 NOTE — ED Notes (Signed)
Pt also complains of multiple intermittent headaches. Pt presents with 2" growth on top of head, no hair growth on it, sensation present.

## 2017-12-17 NOTE — ED Provider Notes (Signed)
[facility] Provider Note   CSN: [id number] Arrival date & time: 12/17/17  1433     History   Chief Complaint Chief Complaint  Patient presents with  . Multiple Falls    HPI [name] is a 84 y.o. female.  HPI 84 year old female presents with multiple different complaints.  History is primarily taken from the daughter in law but also somewhat from the patient and son.  The patient has been falling about 6 times over the last 4 weeks.  The patient tells me that she usually trips which causes her to fall.  At least once a may be a couple times she has hit her head during these times.  The patient has a history of chronic migraines which she states are continuing to occur.  However about 1 week ago she had a migraine but otherwise felt similar to multiple prior headaches except she had a hard time speaking for 2 hours.  This is never happened before or since.  There is no new weakness but she has chronic left leg weakness that has been bothering her for several months and the son states for up to 1 year.  She usually walks with a cane.  During 1 of the first falls a few weeks ago she feels like she injured her knee and this is been sore since.  She also injured her right upper arm in a fall a couple days ago.  She has a growth to the top of her head that has been there for about 2 years but seems to be getting bigger and seem to be weeping a couple days ago.  Family is asking for referral to [facility] for [facility].  Has been having chronic itching and red rash that seems to slowly be worsening. Have been told it's psoriasis in past.They have a follow-up with a PCP for the first time in January but were told of the came to the ER they could get seen in the PCP office earlier.  Past Medical History:  Diagnosis Date  . COLONIC POLYPS, HX OF 08/02/2006  . DIVERTICULOSIS, COLON 08/02/2006  . GERD 08/02/2006  . HYPERTENSION 08/02/2006  . HYPERTHYROIDISM  08/02/2006  . OSTEOARTHRITIS 08/02/2006  . Upper GI bleed     Patient Active Problem List   Diagnosis Date Noted  . Anemia associated with acute blood loss 06/18/2010  . Upper GI bleed 06/18/2010  . Elevated liver enzymes 05/29/2010  . Psoriasis 05/28/2010  . HYPERTHYROIDISM 08/02/2006  . HYPERTENSION 08/02/2006  . GERD 08/02/2006  . DIVERTICULOSIS, COLON 08/02/2006  . OSTEOARTHRITIS 08/02/2006  . COLONIC POLYPS, HX OF 08/02/2006    Past Surgical History:  Procedure Laterality Date  . ABDOMINAL HYSTERECTOMY    . APPENDECTOMY    . ESOPHAGOGASTRODUODENOSCOPY    . SPINAL FUSION      cervical x2     OB History   None      Home Medications    Prior to Admission medications   Medication Sig Start Date End Date Taking? Authorizing Provider  ibuprofen (ADVIL,MOTRIN) 200 MG tablet Take 200 mg by mouth every 6 (six) hours as needed.   Yes [provider]  bisoprolol-hydrochlorothiazide [facility]) 5-6.25 MG per tablet TAKE 1 TABLET BY MOUTH EVERY DAY Patient not taking: Reported on 12/17/2017 03/07/14   Marletta Lor, MD  FeFum-FePoly-FA-B Cmp-C-Biot (INTEGRA PLUS) CAPS Take 1 capsule by mouth 1 dose over 24 hours. Patient not taking: Reported on 12/17/2017 07/21/10  Irene Shipper, MD  PREVACID 30 MG capsule TAKE ONE CAPSULE BY MOUTH EVERY DAY Patient not taking: Reported on 12/17/2017 04/20/14   Marletta Lor, MD  triamcinolone cream (KENALOG) 0.1 % Apply 1 application topically 2 (two) times daily. 12/17/17   Sherwood Gambler, MD    Family History Family History  Problem Relation Age of Onset  . Pancreatic cancer Mother   . Stroke Father   . Heart attack Brother   . Rheumatic fever Sister 61  . Heart disease Sister        Right carotid endarterectomy    Social History Social History   Tobacco Use  . Smoking status: Never Smoker  . Smokeless tobacco: Never Used  Substance Use Topics  . Alcohol use: No  . Drug use: No     Allergies   Patient  has no known allergies.   Review of Systems Review of Systems  Respiratory: Negative for shortness of breath.   Cardiovascular: Negative for chest pain.  Gastrointestinal: Negative for abdominal pain and vomiting.  Musculoskeletal: Positive for arthralgias. Negative for back pain and neck pain.  Neurological: Positive for weakness and headaches.  All other systems reviewed and are negative.    Physical Exam Updated Vital Signs BP (!) 142/75   Pulse 78   Temp 97.9 F (36.6 C) (Oral)   Resp (!) 21   Ht 5\' 3"  (1.6 m)   Wt 68 kg   SpO2 98%   BMI 26.57 kg/m   Physical Exam  Constitutional: She appears well-developed and well-nourished.  HENT:  Head: Normocephalic and atraumatic.    Right Ear: External ear normal.  Left Ear: External ear normal.  Nose: Nose normal.  Eyes: EOM are normal. Right eye exhibits no discharge. Left eye exhibits no discharge.  Neck: Neck supple. No spinous process tenderness and no muscular tenderness present.  Cardiovascular: Normal rate and regular rhythm.  Murmur heard. Pulses:      Radial pulses are 2+ on the right side.       Dorsalis pedis pulses are 2+ on the left side.  Pulmonary/Chest: Effort normal and breath sounds normal.  Abdominal: Soft. There is no tenderness.  Musculoskeletal:       Right shoulder: She exhibits normal range of motion and no tenderness.       Left hip: She exhibits normal range of motion and no tenderness.       Left knee: She exhibits normal range of motion and no swelling. Tenderness (minimal) found.       Cervical back: She exhibits no tenderness.       Thoracic back: She exhibits no tenderness.       Lumbar back: She exhibits no tenderness.       Back:       Right upper arm: She exhibits no tenderness, no bony tenderness and no swelling.  Neurological: She is alert.  CN 3-12 grossly intact. 5/5 strength in RUE, RLE, LUE. 4/5 strength in LL. Grossly normal sensation. Normal finger to nose.   Skin: Skin is  warm and dry.  Psychiatric: Her mood appears not anxious.  Nursing note and vitals reviewed.    ED Treatments / Results  Labs (all labs ordered are listed, but only abnormal results are displayed) Labs Reviewed  COMPREHENSIVE METABOLIC PANEL - Abnormal; Notable for the following components:      Result Value   Glucose, Bld 106 (*)    Creatinine, Ser 1.17 (*)    GFR calc non  Af Amer 41 (*)    GFR calc Af Amer 48 (*)    All other components within normal limits  CBC WITH DIFFERENTIAL/PLATELET    EKG EKG Interpretation  Date/Time:  Friday December 17 2017 15:39:02 EST Ventricular Rate:  75 PR Interval:    QRS Duration: 136 QT Interval:  445 QTC Calculation: 498 R Axis:   114 Text Interpretation:  Sinus rhythm Left bundle branch block LBBB new since 2012 Confirmed by Sherwood Gambler [phone] on 12/17/2017 4:22:53 PM   Radiology Ct Head Wo Contrast  Result Date: 12/17/2017 CLINICAL DATA:  Intermittent headache EXAM: CT HEAD WITHOUT CONTRAST TECHNIQUE: Contiguous axial images were obtained from the base of the skull through the vertex without intravenous contrast. COMPARISON:  None. FINDINGS: Brain: No acute territorial infarction, hemorrhage or intracranial mass is visualized. Moderate atrophy. Mild to moderate small vessel ischemic changes of the white matter. Mildly prominent ventricles, felt secondary to atrophy. Vascular: No hyperdense vessels.  Carotid vascular calcification Skull: Normal. Negative for fracture or focal lesion. Sinuses/Orbits: Small osteoma left ethmoid sinus. No acute orbital abnormality Other: Large soft tissue mass measuring 3.4 cm transverse by 2.8 cm craniocaudad by 3.7 cm AP within the left frontal scalp soft tissues near the cranial vertex. Mass contains scattered internal calcifications. IMPRESSION: 1. No acute intracranial abnormality. Atrophy and small vessel ischemic changes of the white matter 2. Large 3.7 cm left frontal scalp soft tissue mass near  the vertex for which correlation with physical examination is recommended. Electronically Signed   By: Donavan Foil M.D.   On: 12/17/2017 16:39   Dg Knee Complete 4 Views Left  Result Date: 12/17/2017 CLINICAL DATA:  Fall, injury EXAM: LEFT KNEE - COMPLETE 4+ VIEW COMPARISON:  None. FINDINGS: No evidence of fracture, dislocation, or joint effusion. No evidence of arthropathy or other focal bone abnormality. Soft tissues are unremarkable. IMPRESSION: Negative. Electronically Signed   By: Franchot Gallo M.D.   On: 12/17/2017 16:16   Dg Humerus Right  Result Date: 12/17/2017 CLINICAL DATA:  Fall, injury EXAM: RIGHT HUMERUS - 2+ VIEW COMPARISON:  None. FINDINGS: Normal alignment no fracture. Degenerative change and mild spurring of the [facility] joint. IMPRESSION: Negative for fracture Electronically Signed   By: Franchot Gallo M.D.   On: 12/17/2017 16:16    Procedures Procedures (including critical care time)  Medications Ordered in ED Medications - No data to display   Initial Impression / Assessment and Plan / ED Course  I have reviewed the triage vital signs and the nursing notes.  Pertinent labs & imaging results that were available during my care of the patient were reviewed by me and considered in my medical decision making (see chart for details).     Patient's left-sided weakness in her leg is chronic according to family.  There is no emergent finding on her CT head and with no neck or back pain I do not think emergent MRIs of spine or brain are needed.  As for her falls, she indicates that she is tripping although it might be that her left leg is not strong enough.  The mass on her scalp has been there for a long time and she will definitely need a dermatologist referral.  She also is following up with PCP.  However there is no other emergent finding such as hyponatremia, renal failure, or severe anemia.  Her vital signs are unremarkable.  She appears stable for discharge home.  I will  prescribe her triamcinolone for the rash.  Final Clinical Impressions(s) / ED Diagnoses   Final diagnoses:  Multiple falls  Scalp mass  Left leg weakness  Psoriasis    ED Discharge Orders         Ordered    triamcinolone cream (KENALOG) 0.1 %  2 times daily     12/17/17 1728           Sherwood Gambler, MD 12/17/17 1735

## 2017-12-17 NOTE — ED Triage Notes (Signed)
Pt arrives to ED with multiple complaints according to her son's girlfriend. Pt has had many recent falls, frequently "caused by weakness in her left leg and causing falls landing on the right side". Pt also has "lots of skin issues on her head, elbows, and buttocks." Pt is alert and oriented upon arrival, son's girlfriend doing all the talking and speaking over pt

## 2017-12-21 DIAGNOSIS — R29898 Other symptoms and signs involving the musculoskeletal system: Secondary | ICD-10-CM | POA: Diagnosis not present

## 2017-12-21 DIAGNOSIS — G459 Transient cerebral ischemic attack, unspecified: Secondary | ICD-10-CM | POA: Diagnosis not present

## 2017-12-21 DIAGNOSIS — L989 Disorder of the skin and subcutaneous tissue, unspecified: Secondary | ICD-10-CM | POA: Diagnosis not present

## 2017-12-21 DIAGNOSIS — R296 Repeated falls: Secondary | ICD-10-CM | POA: Diagnosis not present

## 2017-12-21 DIAGNOSIS — G43909 Migraine, unspecified, not intractable, without status migrainosus: Secondary | ICD-10-CM | POA: Diagnosis not present

## 2017-12-21 DIAGNOSIS — L400 Psoriasis vulgaris: Secondary | ICD-10-CM | POA: Diagnosis not present

## 2017-12-21 DIAGNOSIS — R269 Unspecified abnormalities of gait and mobility: Secondary | ICD-10-CM | POA: Diagnosis not present

## 2017-12-21 DIAGNOSIS — L409 Psoriasis, unspecified: Secondary | ICD-10-CM | POA: Diagnosis not present

## 2017-12-21 DIAGNOSIS — D649 Anemia, unspecified: Secondary | ICD-10-CM | POA: Diagnosis not present

## 2017-12-21 DIAGNOSIS — I10 Essential (primary) hypertension: Secondary | ICD-10-CM | POA: Diagnosis not present

## 2017-12-23 DIAGNOSIS — L400 Psoriasis vulgaris: Secondary | ICD-10-CM | POA: Diagnosis not present

## 2017-12-23 DIAGNOSIS — C4442 Squamous cell carcinoma of skin of scalp and neck: Secondary | ICD-10-CM | POA: Diagnosis not present

## 2017-12-30 DIAGNOSIS — D649 Anemia, unspecified: Secondary | ICD-10-CM | POA: Diagnosis not present

## 2017-12-30 DIAGNOSIS — L400 Psoriasis vulgaris: Secondary | ICD-10-CM | POA: Diagnosis not present

## 2017-12-30 DIAGNOSIS — Z8673 Personal history of transient ischemic attack (TIA), and cerebral infarction without residual deficits: Secondary | ICD-10-CM | POA: Diagnosis not present

## 2017-12-30 DIAGNOSIS — R296 Repeated falls: Secondary | ICD-10-CM | POA: Diagnosis not present

## 2017-12-30 DIAGNOSIS — I10 Essential (primary) hypertension: Secondary | ICD-10-CM | POA: Diagnosis not present

## 2017-12-30 DIAGNOSIS — M6281 Muscle weakness (generalized): Secondary | ICD-10-CM | POA: Diagnosis not present

## 2017-12-31 DIAGNOSIS — M6281 Muscle weakness (generalized): Secondary | ICD-10-CM | POA: Diagnosis not present

## 2017-12-31 DIAGNOSIS — R296 Repeated falls: Secondary | ICD-10-CM | POA: Diagnosis not present

## 2017-12-31 DIAGNOSIS — L400 Psoriasis vulgaris: Secondary | ICD-10-CM | POA: Diagnosis not present

## 2017-12-31 DIAGNOSIS — Z8673 Personal history of transient ischemic attack (TIA), and cerebral infarction without residual deficits: Secondary | ICD-10-CM | POA: Diagnosis not present

## 2017-12-31 DIAGNOSIS — I10 Essential (primary) hypertension: Secondary | ICD-10-CM | POA: Diagnosis not present

## 2017-12-31 DIAGNOSIS — D649 Anemia, unspecified: Secondary | ICD-10-CM | POA: Diagnosis not present

## 2018-01-04 DIAGNOSIS — M6281 Muscle weakness (generalized): Secondary | ICD-10-CM | POA: Diagnosis not present

## 2018-01-04 DIAGNOSIS — G459 Transient cerebral ischemic attack, unspecified: Secondary | ICD-10-CM | POA: Diagnosis not present

## 2018-01-04 DIAGNOSIS — R296 Repeated falls: Secondary | ICD-10-CM | POA: Diagnosis not present

## 2018-01-04 DIAGNOSIS — L400 Psoriasis vulgaris: Secondary | ICD-10-CM | POA: Diagnosis not present

## 2018-01-04 DIAGNOSIS — D649 Anemia, unspecified: Secondary | ICD-10-CM | POA: Diagnosis not present

## 2018-01-04 DIAGNOSIS — E782 Mixed hyperlipidemia: Secondary | ICD-10-CM | POA: Diagnosis not present

## 2018-01-04 DIAGNOSIS — Z8673 Personal history of transient ischemic attack (TIA), and cerebral infarction without residual deficits: Secondary | ICD-10-CM | POA: Diagnosis not present

## 2018-01-04 DIAGNOSIS — I10 Essential (primary) hypertension: Secondary | ICD-10-CM | POA: Diagnosis not present

## 2018-01-05 DIAGNOSIS — M6281 Muscle weakness (generalized): Secondary | ICD-10-CM | POA: Diagnosis not present

## 2018-01-05 DIAGNOSIS — D649 Anemia, unspecified: Secondary | ICD-10-CM | POA: Diagnosis not present

## 2018-01-05 DIAGNOSIS — L400 Psoriasis vulgaris: Secondary | ICD-10-CM | POA: Diagnosis not present

## 2018-01-05 DIAGNOSIS — I10 Essential (primary) hypertension: Secondary | ICD-10-CM | POA: Diagnosis not present

## 2018-01-05 DIAGNOSIS — R296 Repeated falls: Secondary | ICD-10-CM | POA: Diagnosis not present

## 2018-01-05 DIAGNOSIS — Z8673 Personal history of transient ischemic attack (TIA), and cerebral infarction without residual deficits: Secondary | ICD-10-CM | POA: Diagnosis not present

## 2018-01-07 DIAGNOSIS — R296 Repeated falls: Secondary | ICD-10-CM | POA: Diagnosis not present

## 2018-01-07 DIAGNOSIS — Z8673 Personal history of transient ischemic attack (TIA), and cerebral infarction without residual deficits: Secondary | ICD-10-CM | POA: Diagnosis not present

## 2018-01-07 DIAGNOSIS — M6281 Muscle weakness (generalized): Secondary | ICD-10-CM | POA: Diagnosis not present

## 2018-01-07 DIAGNOSIS — L400 Psoriasis vulgaris: Secondary | ICD-10-CM | POA: Diagnosis not present

## 2018-01-07 DIAGNOSIS — I10 Essential (primary) hypertension: Secondary | ICD-10-CM | POA: Diagnosis not present

## 2018-01-07 DIAGNOSIS — D649 Anemia, unspecified: Secondary | ICD-10-CM | POA: Diagnosis not present

## 2018-01-10 DIAGNOSIS — L400 Psoriasis vulgaris: Secondary | ICD-10-CM | POA: Diagnosis not present

## 2018-01-10 DIAGNOSIS — R296 Repeated falls: Secondary | ICD-10-CM | POA: Diagnosis not present

## 2018-01-10 DIAGNOSIS — Z8673 Personal history of transient ischemic attack (TIA), and cerebral infarction without residual deficits: Secondary | ICD-10-CM | POA: Diagnosis not present

## 2018-01-10 DIAGNOSIS — I10 Essential (primary) hypertension: Secondary | ICD-10-CM | POA: Diagnosis not present

## 2018-01-10 DIAGNOSIS — D649 Anemia, unspecified: Secondary | ICD-10-CM | POA: Diagnosis not present

## 2018-01-10 DIAGNOSIS — M6281 Muscle weakness (generalized): Secondary | ICD-10-CM | POA: Diagnosis not present

## 2018-01-11 DIAGNOSIS — L400 Psoriasis vulgaris: Secondary | ICD-10-CM | POA: Diagnosis not present

## 2018-01-11 DIAGNOSIS — I10 Essential (primary) hypertension: Secondary | ICD-10-CM | POA: Diagnosis not present

## 2018-01-11 DIAGNOSIS — R296 Repeated falls: Secondary | ICD-10-CM | POA: Diagnosis not present

## 2018-01-11 DIAGNOSIS — Z8673 Personal history of transient ischemic attack (TIA), and cerebral infarction without residual deficits: Secondary | ICD-10-CM | POA: Diagnosis not present

## 2018-01-11 DIAGNOSIS — D649 Anemia, unspecified: Secondary | ICD-10-CM | POA: Diagnosis not present

## 2018-01-11 DIAGNOSIS — M6281 Muscle weakness (generalized): Secondary | ICD-10-CM | POA: Diagnosis not present

## 2018-01-14 DIAGNOSIS — L400 Psoriasis vulgaris: Secondary | ICD-10-CM | POA: Diagnosis not present

## 2018-01-14 DIAGNOSIS — Z8673 Personal history of transient ischemic attack (TIA), and cerebral infarction without residual deficits: Secondary | ICD-10-CM | POA: Diagnosis not present

## 2018-01-14 DIAGNOSIS — I10 Essential (primary) hypertension: Secondary | ICD-10-CM | POA: Diagnosis not present

## 2018-01-14 DIAGNOSIS — D649 Anemia, unspecified: Secondary | ICD-10-CM | POA: Diagnosis not present

## 2018-01-14 DIAGNOSIS — R296 Repeated falls: Secondary | ICD-10-CM | POA: Diagnosis not present

## 2018-01-14 DIAGNOSIS — M6281 Muscle weakness (generalized): Secondary | ICD-10-CM | POA: Diagnosis not present

## 2018-01-18 DIAGNOSIS — L400 Psoriasis vulgaris: Secondary | ICD-10-CM | POA: Diagnosis not present

## 2018-01-18 DIAGNOSIS — D649 Anemia, unspecified: Secondary | ICD-10-CM | POA: Diagnosis not present

## 2018-01-18 DIAGNOSIS — M6281 Muscle weakness (generalized): Secondary | ICD-10-CM | POA: Diagnosis not present

## 2018-01-18 DIAGNOSIS — Z8673 Personal history of transient ischemic attack (TIA), and cerebral infarction without residual deficits: Secondary | ICD-10-CM | POA: Diagnosis not present

## 2018-01-18 DIAGNOSIS — I10 Essential (primary) hypertension: Secondary | ICD-10-CM | POA: Diagnosis not present

## 2018-01-18 DIAGNOSIS — R296 Repeated falls: Secondary | ICD-10-CM | POA: Diagnosis not present

## 2018-01-20 DIAGNOSIS — L400 Psoriasis vulgaris: Secondary | ICD-10-CM | POA: Diagnosis not present

## 2018-01-20 DIAGNOSIS — Z8673 Personal history of transient ischemic attack (TIA), and cerebral infarction without residual deficits: Secondary | ICD-10-CM | POA: Diagnosis not present

## 2018-01-20 DIAGNOSIS — M6281 Muscle weakness (generalized): Secondary | ICD-10-CM | POA: Diagnosis not present

## 2018-01-20 DIAGNOSIS — D649 Anemia, unspecified: Secondary | ICD-10-CM | POA: Diagnosis not present

## 2018-01-20 DIAGNOSIS — R296 Repeated falls: Secondary | ICD-10-CM | POA: Diagnosis not present

## 2018-01-20 DIAGNOSIS — I10 Essential (primary) hypertension: Secondary | ICD-10-CM | POA: Diagnosis not present

## 2018-01-21 DIAGNOSIS — L409 Psoriasis, unspecified: Secondary | ICD-10-CM | POA: Diagnosis not present

## 2018-01-21 DIAGNOSIS — C4442 Squamous cell carcinoma of skin of scalp and neck: Secondary | ICD-10-CM | POA: Diagnosis not present

## 2018-01-21 DIAGNOSIS — R296 Repeated falls: Secondary | ICD-10-CM | POA: Diagnosis not present

## 2018-01-21 DIAGNOSIS — Z0000 Encounter for general adult medical examination without abnormal findings: Secondary | ICD-10-CM | POA: Diagnosis not present

## 2018-01-21 DIAGNOSIS — I10 Essential (primary) hypertension: Secondary | ICD-10-CM | POA: Diagnosis not present

## 2018-01-24 DIAGNOSIS — I10 Essential (primary) hypertension: Secondary | ICD-10-CM | POA: Diagnosis not present

## 2018-01-24 DIAGNOSIS — D649 Anemia, unspecified: Secondary | ICD-10-CM | POA: Diagnosis not present

## 2018-01-24 DIAGNOSIS — Z8673 Personal history of transient ischemic attack (TIA), and cerebral infarction without residual deficits: Secondary | ICD-10-CM | POA: Diagnosis not present

## 2018-01-24 DIAGNOSIS — L400 Psoriasis vulgaris: Secondary | ICD-10-CM | POA: Diagnosis not present

## 2018-01-24 DIAGNOSIS — R296 Repeated falls: Secondary | ICD-10-CM | POA: Diagnosis not present

## 2018-01-24 DIAGNOSIS — M6281 Muscle weakness (generalized): Secondary | ICD-10-CM | POA: Diagnosis not present

## 2018-01-25 DIAGNOSIS — L400 Psoriasis vulgaris: Secondary | ICD-10-CM | POA: Diagnosis not present

## 2018-01-25 DIAGNOSIS — M6281 Muscle weakness (generalized): Secondary | ICD-10-CM | POA: Diagnosis not present

## 2018-01-25 DIAGNOSIS — D649 Anemia, unspecified: Secondary | ICD-10-CM | POA: Diagnosis not present

## 2018-01-25 DIAGNOSIS — I10 Essential (primary) hypertension: Secondary | ICD-10-CM | POA: Diagnosis not present

## 2018-01-25 DIAGNOSIS — Z8673 Personal history of transient ischemic attack (TIA), and cerebral infarction without residual deficits: Secondary | ICD-10-CM | POA: Diagnosis not present

## 2018-01-25 DIAGNOSIS — R296 Repeated falls: Secondary | ICD-10-CM | POA: Diagnosis not present

## 2018-02-04 DIAGNOSIS — I10 Essential (primary) hypertension: Secondary | ICD-10-CM | POA: Diagnosis not present

## 2018-02-04 DIAGNOSIS — L400 Psoriasis vulgaris: Secondary | ICD-10-CM | POA: Diagnosis not present

## 2018-02-04 DIAGNOSIS — M6281 Muscle weakness (generalized): Secondary | ICD-10-CM | POA: Diagnosis not present

## 2018-02-04 DIAGNOSIS — R296 Repeated falls: Secondary | ICD-10-CM | POA: Diagnosis not present

## 2018-02-04 DIAGNOSIS — Z8673 Personal history of transient ischemic attack (TIA), and cerebral infarction without residual deficits: Secondary | ICD-10-CM | POA: Diagnosis not present

## 2018-02-04 DIAGNOSIS — D649 Anemia, unspecified: Secondary | ICD-10-CM | POA: Diagnosis not present

## 2018-02-10 DIAGNOSIS — R296 Repeated falls: Secondary | ICD-10-CM | POA: Diagnosis not present

## 2018-02-10 DIAGNOSIS — L400 Psoriasis vulgaris: Secondary | ICD-10-CM | POA: Diagnosis not present

## 2018-02-10 DIAGNOSIS — M6281 Muscle weakness (generalized): Secondary | ICD-10-CM | POA: Diagnosis not present

## 2018-02-10 DIAGNOSIS — I10 Essential (primary) hypertension: Secondary | ICD-10-CM | POA: Diagnosis not present

## 2018-02-10 DIAGNOSIS — D649 Anemia, unspecified: Secondary | ICD-10-CM | POA: Diagnosis not present

## 2018-02-10 DIAGNOSIS — Z8673 Personal history of transient ischemic attack (TIA), and cerebral infarction without residual deficits: Secondary | ICD-10-CM | POA: Diagnosis not present

## 2018-02-11 DIAGNOSIS — C4442 Squamous cell carcinoma of skin of scalp and neck: Secondary | ICD-10-CM | POA: Diagnosis not present

## 2018-02-15 DIAGNOSIS — L400 Psoriasis vulgaris: Secondary | ICD-10-CM | POA: Diagnosis not present

## 2018-02-15 DIAGNOSIS — Z8673 Personal history of transient ischemic attack (TIA), and cerebral infarction without residual deficits: Secondary | ICD-10-CM | POA: Diagnosis not present

## 2018-02-15 DIAGNOSIS — I10 Essential (primary) hypertension: Secondary | ICD-10-CM | POA: Diagnosis not present

## 2018-02-15 DIAGNOSIS — M6281 Muscle weakness (generalized): Secondary | ICD-10-CM | POA: Diagnosis not present

## 2018-02-15 DIAGNOSIS — R296 Repeated falls: Secondary | ICD-10-CM | POA: Diagnosis not present

## 2018-02-15 DIAGNOSIS — D649 Anemia, unspecified: Secondary | ICD-10-CM | POA: Diagnosis not present

## 2018-02-25 DIAGNOSIS — R296 Repeated falls: Secondary | ICD-10-CM | POA: Diagnosis not present

## 2018-02-25 DIAGNOSIS — D649 Anemia, unspecified: Secondary | ICD-10-CM | POA: Diagnosis not present

## 2018-02-25 DIAGNOSIS — I10 Essential (primary) hypertension: Secondary | ICD-10-CM | POA: Diagnosis not present

## 2018-02-25 DIAGNOSIS — L400 Psoriasis vulgaris: Secondary | ICD-10-CM | POA: Diagnosis not present

## 2018-02-25 DIAGNOSIS — M6281 Muscle weakness (generalized): Secondary | ICD-10-CM | POA: Diagnosis not present

## 2018-02-25 DIAGNOSIS — Z8673 Personal history of transient ischemic attack (TIA), and cerebral infarction without residual deficits: Secondary | ICD-10-CM | POA: Diagnosis not present

## 2018-10-18 DIAGNOSIS — M25562 Pain in left knee: Secondary | ICD-10-CM | POA: Diagnosis not present

## 2018-10-18 DIAGNOSIS — M1990 Unspecified osteoarthritis, unspecified site: Secondary | ICD-10-CM | POA: Diagnosis not present

## 2018-10-31 DIAGNOSIS — M25562 Pain in left knee: Secondary | ICD-10-CM | POA: Diagnosis not present

## 2018-10-31 DIAGNOSIS — M25552 Pain in left hip: Secondary | ICD-10-CM | POA: Diagnosis not present

## 2018-10-31 DIAGNOSIS — M1612 Unilateral primary osteoarthritis, left hip: Secondary | ICD-10-CM | POA: Diagnosis not present

## 2018-11-02 ENCOUNTER — Other Ambulatory Visit: Payer: Self-pay | Admitting: Orthopedic Surgery

## 2018-11-03 DIAGNOSIS — L400 Psoriasis vulgaris: Secondary | ICD-10-CM | POA: Diagnosis not present

## 2018-11-03 DIAGNOSIS — I10 Essential (primary) hypertension: Secondary | ICD-10-CM | POA: Diagnosis not present

## 2018-11-03 DIAGNOSIS — Z01818 Encounter for other preprocedural examination: Secondary | ICD-10-CM | POA: Diagnosis not present

## 2018-11-03 DIAGNOSIS — Z01810 Encounter for preprocedural cardiovascular examination: Secondary | ICD-10-CM | POA: Diagnosis not present

## 2018-11-04 ENCOUNTER — Other Ambulatory Visit: Payer: Self-pay

## 2018-11-04 ENCOUNTER — Other Ambulatory Visit: Payer: Self-pay | Admitting: Family Medicine

## 2018-11-04 ENCOUNTER — Ambulatory Visit
Admission: RE | Admit: 2018-11-04 | Discharge: 2018-11-04 | Disposition: A | Discharge status: Home / Self Care | Payer: Medicare Other | Source: Ambulatory Visit | Attending: Family Medicine | Admitting: Family Medicine

## 2018-11-04 DIAGNOSIS — Z01818 Encounter for other preprocedural examination: Secondary | ICD-10-CM

## 2018-12-01 ENCOUNTER — Encounter: Payer: Self-pay | Admitting: Cardiology

## 2018-12-02 ENCOUNTER — Other Ambulatory Visit: Payer: Self-pay

## 2018-12-02 ENCOUNTER — Ambulatory Visit (INDEPENDENT_AMBULATORY_CARE_PROVIDER_SITE_OTHER): Payer: Medicare Other | Admitting: Cardiology

## 2018-12-02 VITALS — BP 169/79 | HR 90 | Ht 63.0 in | Wt 153.0 lb

## 2018-12-02 DIAGNOSIS — I447 Left bundle-branch block, unspecified: Secondary | ICD-10-CM | POA: Diagnosis not present

## 2018-12-02 DIAGNOSIS — Z01810 Encounter for preprocedural cardiovascular examination: Secondary | ICD-10-CM | POA: Insufficient documentation

## 2018-12-02 DIAGNOSIS — I10 Essential (primary) hypertension: Secondary | ICD-10-CM | POA: Diagnosis not present

## 2018-12-02 NOTE — Patient Instructions (Signed)
Medication Instructions:  NO CHANGES     Lab Work: NOT NEEDED  Testing/Procedures: WILL BE SCHEDULE AT [facility] Your physician has requested that you have an echocardiogram. Echocardiography is a painless test that uses sound waves to create images of your heart. It provides your doctor with information about the size and shape of your heart and how well your heart's chambers and valves are working. This procedure takes approximately one hour. There are no restrictions for this procedure.    Follow-Up: At [facility], you and your health needs are our priority.  As part of our continuing mission to provide you with exceptional heart care, we have created designated Provider Care Teams.  These Care Teams include your primary Cardiologist (physician) and Advanced Practice Providers (APPs -  Physician Assistants and Nurse Practitioners) who all work together to provide you with the care you need, when you need it.  Your next appointment:   AS NEEDED UNLESS TEST ABNORMAL APPOINTMENT WILL BE SCHEDULE  The format for your next appointment:   In Person  Provider:   Dr Ellyn Hack  Other Instructions *

## 2018-12-02 NOTE — Progress Notes (Signed)
Primary Care Provider: Caren Macadam, MD  Cardiologist: No primary care provider on file. Electrophysiologist: NONE Orthopedic Surgeon: Dr. Lillia Corporal   Clinic Note: Chief Complaint  Patient presents with  . New Patient (Initial Visit)  . Pre-op Exam    Left hip replacement    HPI:    [name] is a 85 y.o. female with a history of hypertension and osteoarthritis who is being seen today for the PREOPERATIVE EVALUATION for LEFT HIP REPLACEMENT at the request of Caren Macadam, MD.  [name] was last seen on October 15 by her PCP who referred her for cardiology evaluation.  Recent Hospitalizations: none  Reviewed  CV studies:    The following studies were reviewed today: (if available, images/films reviewed: From Epic Chart or Care Everywhere) . n/a:   Interval History:   [name] is here today really unsure as to why.  All she knows that she needs her hip replaced so she can continue to walk.  It appears that she is relatively sedentary at baseline because of the hip bothering her.  When I asked her how much exercise she does, she says hardly anything.  She really does not do much of anything around the house.  No exercise, notes lack of endurance, gets tired, mostly bothered by hip pain.  She herself is relatively poor historian, her son has to really help out.  She is not having any symptoms of resting dyspnea or chest pain pressure.  No chest pain or pressure with walking around.  No arrhythmia sensations of rapid irregular heartbeats palpitations.  No syncope/near syncope or TIA/amaurosis fugax.  No claudication.  She denies any PND, orthopnea or edema.  The patient does not have symptoms concerning for COVID-19 infection (fever, chills, cough, or new shortness of breath).  The patient is practicing social distancing.  She does mask up when going out which does not happen very much.  Her son pretty much takes care of most of the shopping etc.   REVIEWED OF  SYSTEMS   A comprehensive ROS was performed. Review of Systems  Constitutional: Positive for malaise/fatigue (Mostly because she says she is "lazy".  She does not do much.,  And is clearly limited by her hip pain.). Negative for weight loss.  HENT: Negative for congestion and nosebleeds.   Respiratory: Negative for cough, shortness of breath and wheezing.   Gastrointestinal: Negative for abdominal pain, blood in stool, heartburn and melena.  Genitourinary: Negative for hematuria.  Neurological: Positive for focal weakness (Weakness in left leg from hip pain). Negative for dizziness (Maybe if she stands up too fast) and weakness.  Psychiatric/Behavioral: Positive for memory loss. Negative for depression. The patient is not nervous/anxious and does not have insomnia.        She is quite a poor historian.  Really does not want to answer questions.  Almost looks at me as if I am crazy for asking her questions.   I have reviewed and (if needed) personally updated the patient's problem list, medications, allergies, past medical and surgical history, social and family history.   PAST MEDICAL HISTORY   Past Medical History:  Diagnosis Date  . COLONIC POLYPS, HX OF 08/02/2006  . DIVERTICULOSIS, COLON 08/02/2006  . Elevated liver enzymes   . GERD 08/02/2006  . HYPERTENSION 08/02/2006  . HYPERTHYROIDISM 08/02/2006  . Iron deficiency anemia secondary to blood loss (chronic)   . OSTEOARTHRITIS 08/02/2006  . Psoriasis   . SCC (squamous cell carcinoma), scalp/neck   .  Upper GI bleed      PAST SURGICAL HISTORY   Past Surgical History:  Procedure Laterality Date  . ABDOMINAL HYSTERECTOMY    . APPENDECTOMY    . ESOPHAGOGASTRODUODENOSCOPY    . SPINAL FUSION      cervical x2     MEDICATIONS/ALLERGIES   Current Meds  Medication Sig  . amLODipine (NORVASC) 5 MG tablet Take 1 tablet by mouth daily.  Marland Kitchen ibuprofen (ADVIL,MOTRIN) 200 MG tablet Take 200 mg by mouth every 6 (six) hours as needed.  .  triamcinolone cream (KENALOG) 0.1 % Apply 1 application topically 2 (two) times daily.    No Known Allergies   SOCIAL HISTORY/FAMILY HISTORY   Social History   Tobacco Use  . Smoking status: Never Smoker  . Smokeless tobacco: Never Used  Substance Use Topics  . Alcohol use: No  . Drug use: No   Social History   Social History Narrative   Widowed mother of 1, grandmother of 2, great-grandmother of 2.     Retired Oncologist for [employer]   She says she does not exercise because she is "lazy ".-Currently limited by hip pain.    Family History family history includes Heart attack in her brother; Pancreatic cancer in her mother; Peripheral Artery Disease in her sister; Rheumatic fever (age of onset: 28) in her sister; Stroke in her father.   OBJCTIVE -PE, EKG, labs   Wt Readings from Last 3 Encounters:  12/02/18 153 lb (69.4 kg)  12/17/17 150 lb (68 kg)  03/01/13 164 lb (74.4 kg)    Physical Exam: BP (!) 169/79   Pulse 90   Ht 5\' 3"  (1.6 m)   Wt 153 lb (69.4 kg)   SpO2 98%   BMI 27.10 kg/m  Physical Exam  Constitutional: She is oriented to person, place, and time. She appears well-developed and well-nourished.  Relatively healthy-appearing 85 year old woman in no acute distress.  Well-groomed  HENT:  Head: Normocephalic and atraumatic.  Eyes: Pupils are equal, round, and reactive to light. Conjunctivae and EOM are normal.  Neck: Normal range of motion. Neck supple. No JVD present.  Cardiovascular: Normal rate, regular rhythm, normal heart sounds and intact distal pulses. Exam reveals no gallop and no friction rub.  No murmur heard. Pulmonary/Chest: Effort normal and breath sounds normal. No respiratory distress. She has no wheezes. She has no rales.  Abdominal: Soft. Bowel sounds are normal. She exhibits no distension. There is no abdominal tenderness.  Musculoskeletal:        General: No edema.     Comments: Somewhat antalgic gait  Neurological: She is alert  and oriented to person, place, and time.  Psychiatric:  Poor historian.  Reluctant to answer questions.  Not sure if this is because of hard of hearing, lack of understanding or just lack of desire to speak.  Vitals reviewed.   Adult ECG Report  Rate: 90 ;  Rhythm: normal sinus rhythm and LBBB, LVH.  Otherwise normal axis, intervals and durations.;   Narrative Interpretation: Relatively stable EKG.  LBBB has been present since at least 2012.  Recent Labs: December 2019-TC 183, HDL 46, LDL 102, TG 175.  A1c 4.9.  CR 0.94. Lab Results  Component Value Date   CREATININE 1.17 (H) 12/17/2017   BUN 12 12/17/2017   NA 139 12/17/2017   K 3.5 12/17/2017   CL 106 12/17/2017   CO2 25 12/17/2017    ASSESSMENT/PLAN    Problem List Items Addressed This Visit  LBBB (left bundle branch block) (Chronic)    Chronic finding on her EKG.  As such, would not be overly worrisome with exception of potentially reduced EF.  Plan: We will check 2D echo just to ensure stable EF.  Suspect septal dyssynergy, but need to ensure her EF is okay.      Relevant Orders   EKG 12-Lead   ECHOCARDIOGRAM COMPLETE   Essential hypertension (Chronic)    Relatively high blood pressures, but in a 85 year old, I would not be overly aggressive.  She takes amlodipine.  Defer to PCP.      Preop cardiovascular exam - Primary    85 year old woman with no history of diabetes, renal insufficiency or stroke, and no documented history of CAD or CHF here for preop evaluation for hip replacement surgery which is an intermediate surgery from cardiac standpoint.  Simply based on her advanced age, she would be at increased risk.  At least class I risk.  Her activity level is limited by her hip pain, is able to walk around and do at least 4 METS without difficulty.  It is reasonable to check a 2D echocardiogram to assess EF based on LBBB on EKG, however I would not recommend ischemic evaluation as this would not change our management  plan.  In the absence of any anginal symptoms, I would not recommend ischemic evaluation as this would only delay a necessary surgery.  Provided the echocardiogram does not show any gross abnormalities of reduced EF or valvular lesions, should be safe to proceed to the OR without any further evaluation.  [facility] will be available if issues were to arise perioperatively.      Relevant Orders   EKG 12-Lead   ECHOCARDIOGRAM COMPLETE      COVID-19 Education: The signs and symptoms of COVID-19 were discussed with the patient and how to seek care for testing (follow up with PCP or arrange E-visit).   The importance of social distancing was discussed today.  I spent a total of 20 minutes with the patient and chart review. >  50% of the time was spent in direct patient consultation.  Additional time spent with chart review (studies, outside notes, etc): 10 Total Time: 30 min   Current medicines are reviewed at length with the patient today.  (+/- concerns) none   Patient Instructions / Medication Changes & Studies & Tests Ordered   Patient Instructions  Medication Instructions:  NO CHANGES     Lab Work: NOT NEEDED  Testing/Procedures: WILL BE SCHEDULE AT [place] [facility] Your physician has requested that you have an echocardiogram. Echocardiography is a painless test that uses sound waves to create images of your heart. It provides your doctor with information about the size and shape of your heart and how well your heart's chambers and valves are working. This procedure takes approximately one hour. There are no restrictions for this procedure.    Follow-Up: At [facility], you and your health needs are our priority.  As part of our continuing mission to provide you with exceptional heart care, we have created designated Provider Care Teams.  These Care Teams include your primary Cardiologist (physician) and Advanced Practice Providers (APPs -  Physician  Assistants and Nurse Practitioners) who all work together to provide you with the care you need, when you need it.  Your next appointment:   AS NEEDED UNLESS TEST ABNORMAL APPOINTMENT WILL BE SCHEDULE  The format for your next appointment:   In Person  Provider:   Dr Ellyn Hack  Other Instructions *  Studies Ordered:   Orders Placed This Encounter  Procedures  . EKG 12-Lead  . ECHOCARDIOGRAM COMPLETE     Glenetta Hew, M.D., M.S. Interventional Cardiologist   Pager # [phone] Phone # [phone] [address] [place], [place] [address]   Thank you for choosing [facility] at [facility]!!

## 2018-12-04 ENCOUNTER — Encounter: Payer: Self-pay | Admitting: Cardiology

## 2018-12-04 NOTE — Assessment & Plan Note (Signed)
85 year old woman with no history of diabetes, renal insufficiency or stroke, and no documented history of CAD or CHF here for preop evaluation for hip replacement surgery which is an intermediate surgery from cardiac standpoint.  Simply based on her advanced age, she would be at increased risk.  At least class I risk.  Her activity level is limited by her hip pain, is able to walk around and do at least 4 METS without difficulty.  It is reasonable to check a 2D echocardiogram to assess EF based on LBBB on EKG, however I would not recommend ischemic evaluation as this would not change our management plan.  In the absence of any anginal symptoms, I would not recommend ischemic evaluation as this would only delay a necessary surgery.  Provided the echocardiogram does not show any gross abnormalities of reduced EF or valvular lesions, should be safe to proceed to the OR without any further evaluation.  [facility] will be available if issues were to arise perioperatively.

## 2018-12-04 NOTE — Assessment & Plan Note (Signed)
Relatively high blood pressures, but in a 85 year old, I would not be overly aggressive.  She takes amlodipine.  Defer to PCP.

## 2018-12-04 NOTE — Assessment & Plan Note (Signed)
Chronic finding on her EKG.  As such, would not be overly worrisome with exception of potentially reduced EF.  Plan: We will check 2D echo just to ensure stable EF.  Suspect septal dyssynergy, but need to ensure her EF is okay.

## 2018-12-06 ENCOUNTER — Telehealth: Payer: Self-pay

## 2018-12-06 NOTE — Telephone Encounter (Signed)
REFERRAL ON FILE SENT REFERRAL TO [facility]

## 2018-12-14 ENCOUNTER — Ambulatory Visit (HOSPITAL_COMMUNITY): Payer: Medicare Other | Attending: Cardiovascular Disease

## 2018-12-14 ENCOUNTER — Other Ambulatory Visit: Payer: Self-pay

## 2018-12-14 DIAGNOSIS — I447 Left bundle-branch block, unspecified: Secondary | ICD-10-CM | POA: Diagnosis not present

## 2018-12-14 DIAGNOSIS — Z01810 Encounter for preprocedural cardiovascular examination: Secondary | ICD-10-CM | POA: Diagnosis not present

## 2018-12-21 ENCOUNTER — Telehealth: Payer: Self-pay | Admitting: *Deleted

## 2018-12-21 NOTE — Telephone Encounter (Signed)
-----   Message from Leonie Man, MD sent at 12/14/2018  4:19 PM EST ----- Echocardiogram result shows normal pump function of 55 to 60% with grade 1 diastolic function that is normal for age.  The left atrium is mildly dilated also normal for age.  The aortic valve has mild sclerosis but no stenosis in the meaning calcium buildup but no narrowing.  Essentially normal echocardiogram.  With this result, there should be no reason to delay surgery.  No change to the preoperative risk noted in my original note.  Glenetta Hew, MD

## 2018-12-21 NOTE — Telephone Encounter (Signed)
CALLED NO ANSWER , PHONE HUNG UP  AFTER 3 RINGS , WILL TRY AGAIN

## 2018-12-26 NOTE — Telephone Encounter (Signed)
Attempted to call the given number - unable for call to go through.  called left Detailed message on son's home phone [phone] - patient's result   any question may call back

## 2018-12-26 NOTE — Telephone Encounter (Signed)
Follow up   Patients son Annie Main is calling to obtain results of the echocardiogram. He can be reached at [phone] if he is not available its okay to speak with [facility]

## 2019-11-30 IMAGING — CR DG CHEST 2V
2 series · 2 of 2 positions shown · non-contrast
Comparison: 06/07/2010

CLINICAL DATA: Preoperative clearance for left hip replacement.

EXAM:
CHEST - 2 VIEW

[w chest pa]
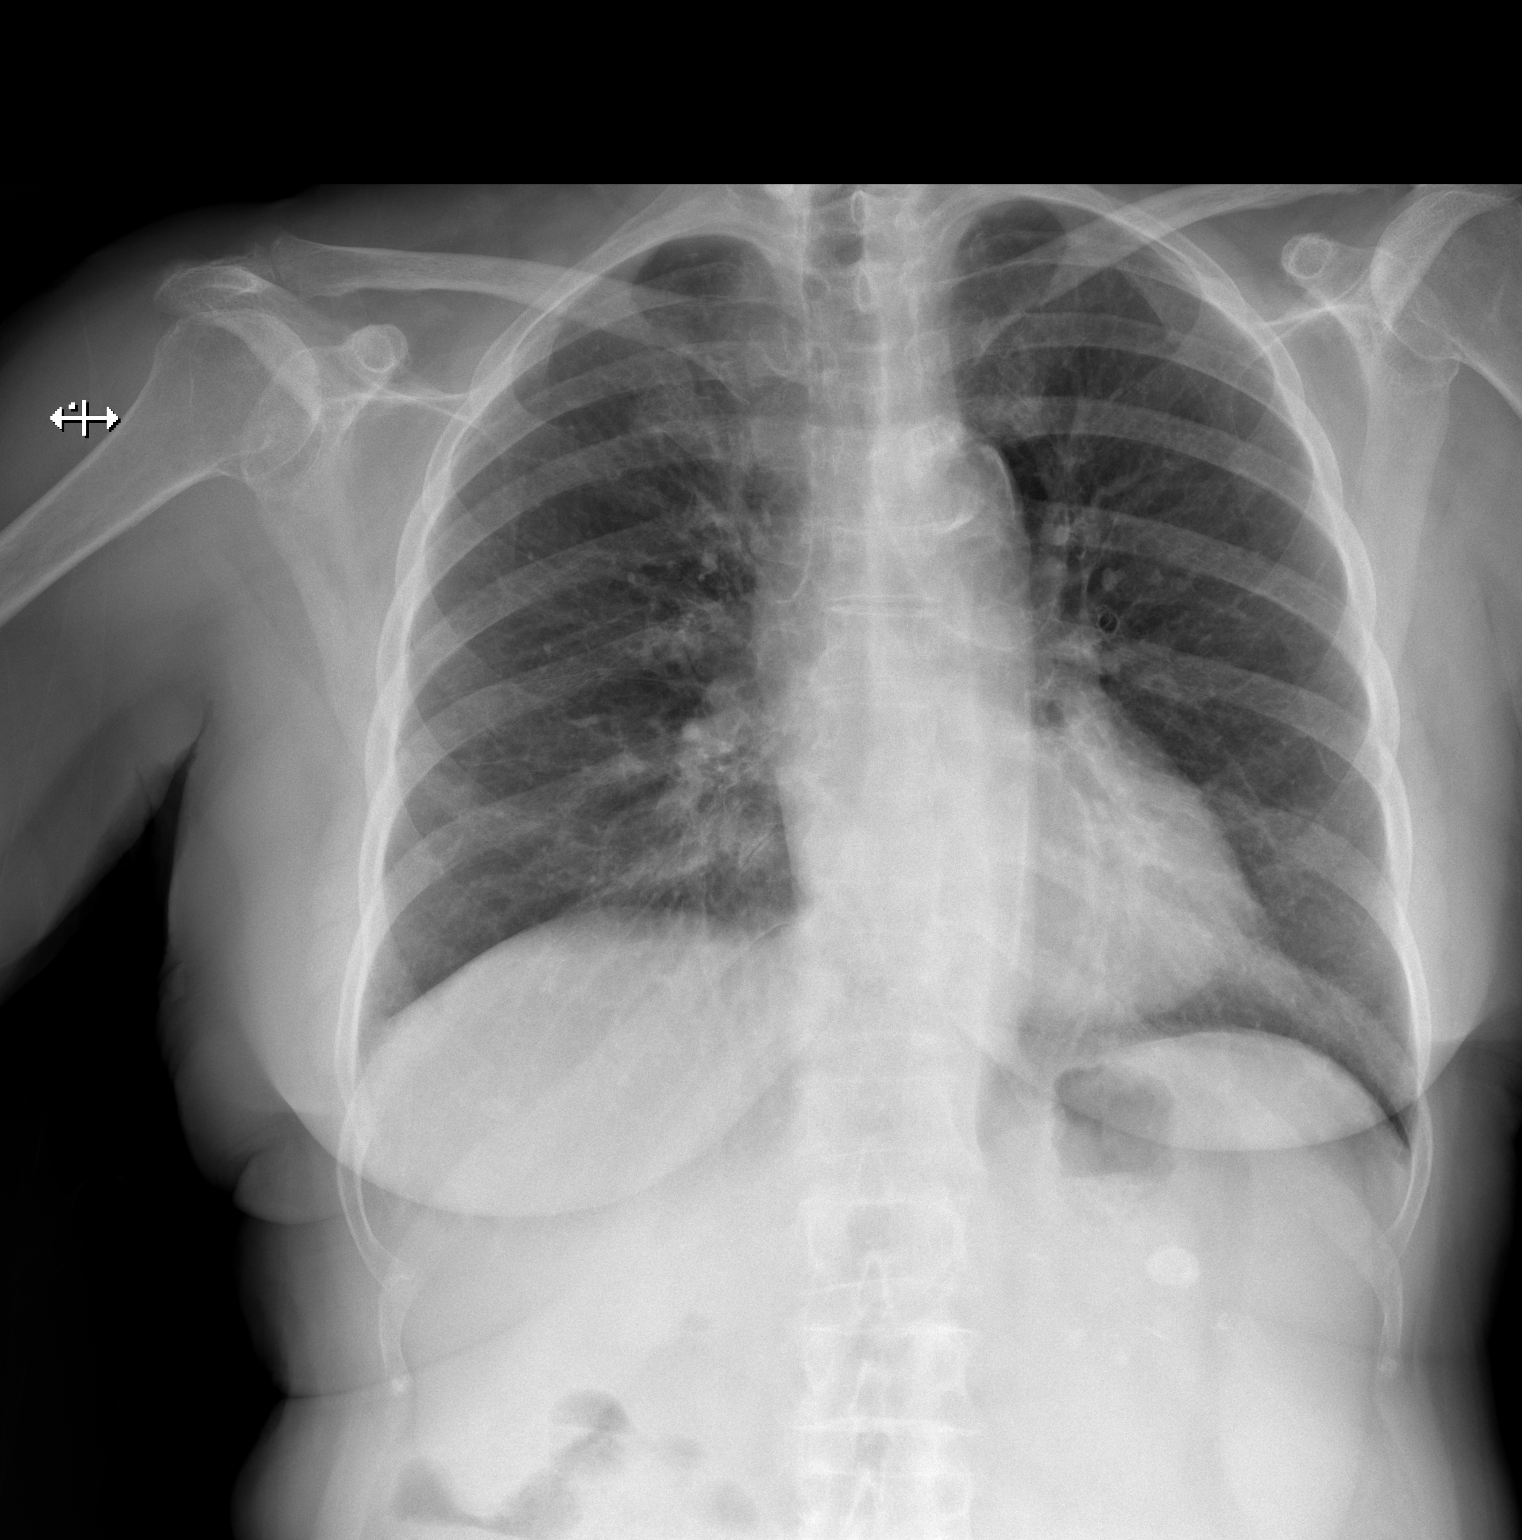

[w chest lat]
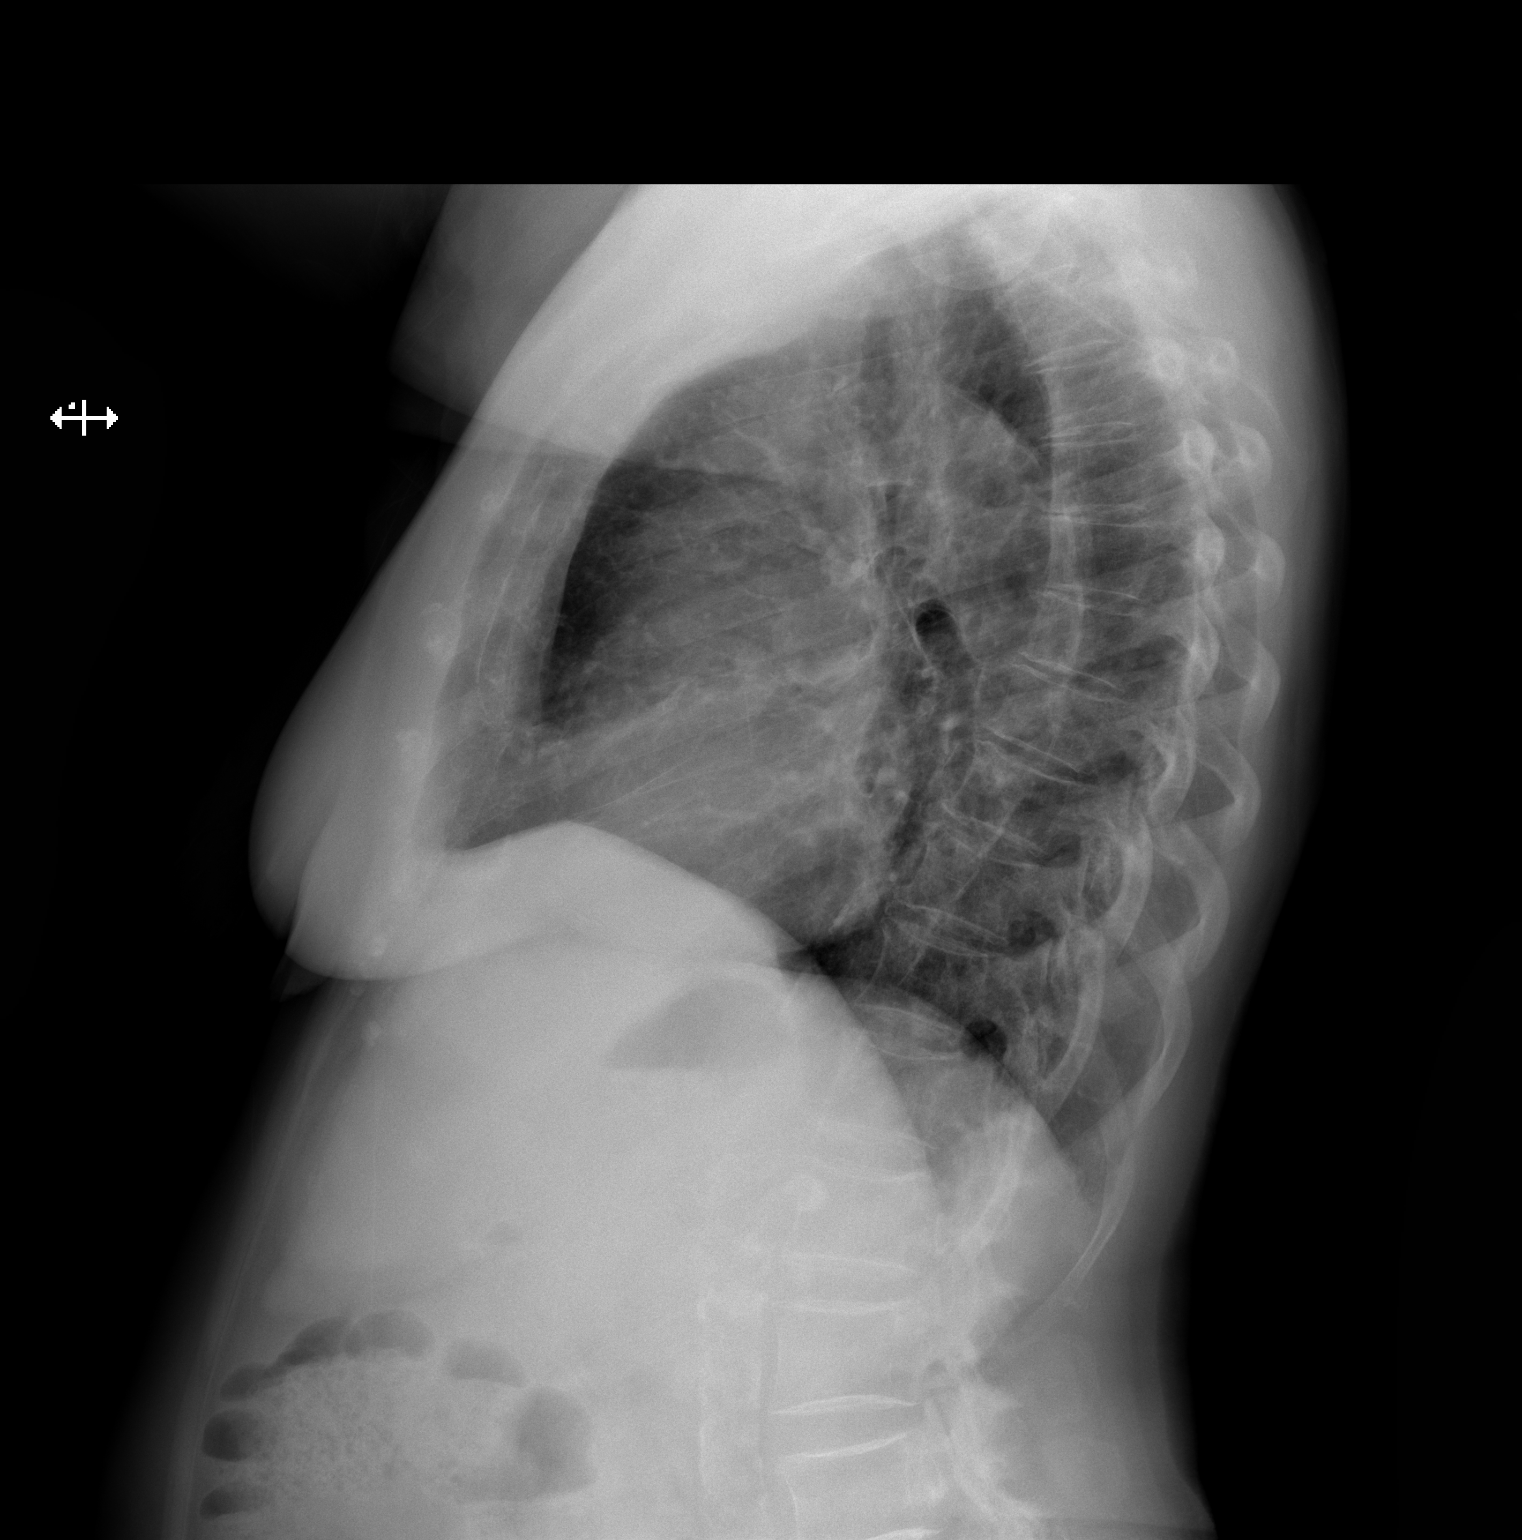

[2 of 2 positions shown; findings below may reference images not displayed]

FINDINGS: Lateral view degraded by patient arm position. Midline trachea.
Normal heart size. Atherosclerosis in the transverse aorta. No
pleural effusion or pneumothorax. Lower lobe predominant
interstitial thickening is similar, given differences in technique.
No lobar consolidation.
IMPRESSION: No acute cardiopulmonary disease.

Interstitial thickening, possibly related to the chronic bronchitis.
Similar to 0700, given differences in technique.

Aortic Atherosclerosis ([id number].Z).

## 2020-07-17 DIAGNOSIS — Z66 Do not resuscitate: Secondary | ICD-10-CM | POA: Diagnosis not present

## 2020-07-17 DIAGNOSIS — L409 Psoriasis, unspecified: Secondary | ICD-10-CM | POA: Diagnosis not present

## 2021-08-27 ENCOUNTER — Telehealth: Payer: Self-pay | Admitting: *Deleted

## 2021-08-27 NOTE — Patient Outreach (Signed)
  Care Coordination   08/27/2021 Name: [name] MRN: [id number] DOB: [birth year]   Care Coordination Outreach Attempts:  An unsuccessful telephone outreach was attempted today to offer the patient information about available care coordination services as a benefit of their health plan.   Follow Up Plan:  Additional outreach attempts will be made to offer the patient care coordination information and services.   Encounter Outcome:  No Answer  Care Coordination Interventions Activated:  No   Care Coordination Interventions:  No, not indicated    Eduard Clos MSW, LCSW Licensed Clinical Social Worker      [phone]

## 2021-11-26 DIAGNOSIS — Z66 Do not resuscitate: Secondary | ICD-10-CM | POA: Diagnosis not present

## 2021-11-26 DIAGNOSIS — R5383 Other fatigue: Secondary | ICD-10-CM | POA: Diagnosis not present

## 2021-11-26 DIAGNOSIS — I10 Essential (primary) hypertension: Secondary | ICD-10-CM | POA: Diagnosis not present

## 2021-11-26 DIAGNOSIS — R55 Syncope and collapse: Secondary | ICD-10-CM | POA: Diagnosis not present

## 2021-11-26 DIAGNOSIS — L400 Psoriasis vulgaris: Secondary | ICD-10-CM | POA: Diagnosis not present

## 2021-11-26 DIAGNOSIS — D696 Thrombocytopenia, unspecified: Secondary | ICD-10-CM | POA: Diagnosis not present

## 2021-11-26 DIAGNOSIS — E559 Vitamin D deficiency, unspecified: Secondary | ICD-10-CM | POA: Diagnosis not present

## 2021-11-26 DIAGNOSIS — F0390 Unspecified dementia without behavioral disturbance: Secondary | ICD-10-CM | POA: Diagnosis not present

## 2022-04-14 DIAGNOSIS — I10 Essential (primary) hypertension: Secondary | ICD-10-CM | POA: Diagnosis not present

## 2022-04-14 DIAGNOSIS — R58 Hemorrhage, not elsewhere classified: Secondary | ICD-10-CM | POA: Diagnosis not present

## 2022-04-14 DIAGNOSIS — F0390 Unspecified dementia without behavioral disturbance: Secondary | ICD-10-CM | POA: Diagnosis not present

## 2022-04-14 DIAGNOSIS — D649 Anemia, unspecified: Secondary | ICD-10-CM | POA: Diagnosis not present

## 2022-04-14 DIAGNOSIS — L409 Psoriasis, unspecified: Secondary | ICD-10-CM | POA: Diagnosis not present

## 2022-04-23 DIAGNOSIS — D696 Thrombocytopenia, unspecified: Secondary | ICD-10-CM | POA: Diagnosis not present

## 2022-04-30 ENCOUNTER — Telehealth: Payer: Self-pay | Admitting: Internal Medicine

## 2022-04-30 NOTE — Telephone Encounter (Signed)
scheduled per referral , pt son has been called and confirmed date and time. Pt is aware of location and to arrive early for check in

## 2022-05-18 ENCOUNTER — Encounter: Payer: Medicare Other | Admitting: Internal Medicine

## 2022-05-18 ENCOUNTER — Other Ambulatory Visit: Payer: Medicare Other

## 2022-05-20 ENCOUNTER — Other Ambulatory Visit: Payer: Self-pay

## 2022-05-20 DIAGNOSIS — D696 Thrombocytopenia, unspecified: Secondary | ICD-10-CM

## 2022-05-21 ENCOUNTER — Inpatient Hospital Stay: Payer: Medicare HMO | Attending: Internal Medicine

## 2022-05-21 ENCOUNTER — Other Ambulatory Visit: Payer: Self-pay

## 2022-05-21 ENCOUNTER — Other Ambulatory Visit: Payer: Self-pay | Admitting: Internal Medicine

## 2022-05-21 ENCOUNTER — Inpatient Hospital Stay (HOSPITAL_BASED_OUTPATIENT_CLINIC_OR_DEPARTMENT_OTHER): Payer: Medicare HMO | Admitting: Internal Medicine

## 2022-05-21 ENCOUNTER — Encounter: Payer: Self-pay | Admitting: Internal Medicine

## 2022-05-21 VITALS — BP 162/73 | HR 78 | Temp 97.6°F | Resp 14 | Wt 155.0 lb

## 2022-05-21 DIAGNOSIS — D696 Thrombocytopenia, unspecified: Secondary | ICD-10-CM

## 2022-05-21 DIAGNOSIS — I10 Essential (primary) hypertension: Secondary | ICD-10-CM | POA: Diagnosis not present

## 2022-05-21 DIAGNOSIS — Z872 Personal history of diseases of the skin and subcutaneous tissue: Secondary | ICD-10-CM

## 2022-05-21 DIAGNOSIS — K219 Gastro-esophageal reflux disease without esophagitis: Secondary | ICD-10-CM | POA: Insufficient documentation

## 2022-05-21 DIAGNOSIS — E039 Hypothyroidism, unspecified: Secondary | ICD-10-CM | POA: Diagnosis not present

## 2022-05-21 DIAGNOSIS — Z862 Personal history of diseases of the blood and blood-forming organs and certain disorders involving the immune mechanism: Secondary | ICD-10-CM | POA: Diagnosis not present

## 2022-05-21 LAB — COMPREHENSIVE METABOLIC PANEL
ALT: 33 U/L (ref 0–44)
AST: 42 U/L — ABNORMAL HIGH (ref 15–41)
Albumin: 4.3 g/dL (ref 3.5–5.0)
Alkaline Phosphatase: 64 U/L (ref 38–126)
Anion gap: 7 (ref 5–15)
BUN: 12 mg/dL (ref 8–23)
CO2: 26 mmol/L (ref 22–32)
Calcium: 9.7 mg/dL (ref 8.9–10.3)
Chloride: 108 mmol/L (ref 98–111)
Creatinine, Ser: 1.07 mg/dL — ABNORMAL HIGH (ref 0.44–1.00)
GFR, Estimated: 48 mL/min — ABNORMAL LOW (ref >60)
Glucose, Bld: 120 mg/dL — ABNORMAL HIGH (ref 70–99)
Potassium: 3.8 mmol/L (ref 3.5–5.1)
Sodium: 141 mmol/L (ref 135–145)
Total Bilirubin: 1 mg/dL (ref 0.3–1.2)
Total Protein: 7.1 g/dL (ref 6.5–8.1)

## 2022-05-21 LAB — CBC WITH DIFFERENTIAL/PLATELET
Abs Immature Granulocytes: 0.01 10*3/uL (ref 0.00–0.07)
Basophils Absolute: 0 10*3/uL (ref 0.0–0.1)
Basophils Relative: 1 %
Eosinophils Absolute: 0.1 10*3/uL (ref 0.0–0.5)
Eosinophils Relative: 3 %
HCT: 39.6 % (ref 36.0–46.0)
Hemoglobin: 13.8 g/dL (ref 12.0–15.0)
Immature Granulocytes: 0 %
Lymphocytes Relative: 23 %
Lymphs Abs: 1 10*3/uL (ref 0.7–4.0)
MCH: 30.6 pg (ref 26.0–34.0)
MCHC: 34.8 g/dL (ref 30.0–36.0)
MCV: 87.8 fL (ref 80.0–100.0)
Monocytes Absolute: 0.4 10*3/uL (ref 0.1–1.0)
Monocytes Relative: 8 %
Neutro Abs: 2.9 10*3/uL (ref 1.7–7.7)
Neutrophils Relative %: 65 %
Platelets: 69 10*3/uL — ABNORMAL LOW (ref 150–400)
RBC: 4.51 MIL/uL (ref 3.87–5.11)
RDW: 14.2 % (ref 11.5–15.5)
Smear Review: NORMAL
WBC: 4.4 10*3/uL (ref 4.0–10.5)
nRBC: 0 % (ref 0.0–0.2)

## 2022-05-21 LAB — IRON AND IRON BINDING CAPACITY (CC-WL,HP ONLY)
Iron: 105 ug/dL (ref 28–170)
Saturation Ratios: 30 % (ref 10.4–31.8)
TIBC: 349 ug/dL (ref 250–450)
UIBC: 244 ug/dL (ref 148–442)

## 2022-05-21 LAB — VITAMIN B12: Vitamin B-12: 1337 pg/mL — ABNORMAL HIGH (ref 180–914)

## 2022-05-21 LAB — FERRITIN: Ferritin: 78 ng/mL (ref 11–307)

## 2022-05-21 LAB — FOLATE: Folate: >40 ng/mL (ref >5.9)

## 2022-05-21 NOTE — Progress Notes (Signed)
[facility] Telephone:([phone]   Fax:([phone]  CONSULT NOTE  REFERRING PHYSICIAN: Dr. Aliene Beams  REASON FOR CONSULTATION:  89 years old white female for evaluation of low platelets.  HPI [name] is a 89 y.o. female with past medical history significant for hypertension, hypothyroidism, osteoarthritis, psoriasis, gastrointestinal bleeding, GERD, diverticulosis, colon polyps as well as history of cervical spine fusion.  The patient was seen by her primary care physician Dr. Tracie Harrier for routine evaluation and during her blood work she was noted to have low platelets count.  Initial labs showed platelet count of 119 but repeat CBC few weeks later showed platelets count down to 78,000 and then on 04/23/2022 it was down to 68,000.  The patient was referred to me today for evaluation and recommendation regarding her condition.  She is not on any new medication except for amlodipine that started few years ago.  She takes over-the-counter Melaleuca supplements.  She denied having any bleeding, bruises or ecchymosis.  She has no nose or gum bleeding. Review of systems today is unremarkable and she has no chest pain, shortness of breath, cough or hemoptysis.  She has no nausea, vomiting, diarrhea or constipation.  She has no headache or visual changes. Family history significant for mother with pancreatic cancer.  Father had stroke and died in an accident.  She had a brother who had heart disease. The patient is a widow and has 1 son, [name] who accompanied her today with his girlfriend [name].  The patient used to work for [employer].  She has no history for smoking, alcohol or drug abuse.  HPI  Past Medical History:  Diagnosis Date   COLONIC POLYPS, HX OF 08/02/2006   DIVERTICULOSIS, COLON 08/02/2006   Elevated liver enzymes    GERD 08/02/2006   HYPERTENSION 08/02/2006   HYPERTHYROIDISM 08/02/2006   Iron deficiency anemia secondary to blood loss  (chronic)    OSTEOARTHRITIS 08/02/2006   Psoriasis    SCC (squamous cell carcinoma), scalp/neck    Upper GI bleed     Past Surgical History:  Procedure Laterality Date   ABDOMINAL HYSTERECTOMY     APPENDECTOMY     ESOPHAGOGASTRODUODENOSCOPY     SPINAL FUSION      cervical x2    Family History  Problem Relation Age of Onset   Pancreatic cancer Mother    Stroke Father    Heart attack Brother    Rheumatic fever Sister 18   Peripheral Artery Disease Sister        Right carotid endarterectomy    Social History Social History   Tobacco Use   Smoking status: Never   Smokeless tobacco: Never  Vaping Use   Vaping Use: Never used  Substance Use Topics   Alcohol use: No   Drug use: No    No Known Allergies  Current Outpatient Medications  Medication Sig Dispense Refill   loratadine (CLARITIN REDITABS) 10 MG dissolvable tablet Take 10 mg by mouth daily.     amLODipine (NORVASC) 5 MG tablet Take 1 tablet by mouth daily.     ibuprofen (ADVIL,MOTRIN) 200 MG tablet Take 200 mg by mouth every 6 (six) hours as needed.     No current facility-administered medications for this visit.    Review of Systems  Constitutional: positive for fatigue Eyes: negative Ears, nose, mouth, throat, and face: negative Respiratory: negative Cardiovascular: negative Gastrointestinal: negative Genitourinary:negative Integument/breast: negative Hematologic/lymphatic: negative Musculoskeletal:negative Neurological: negative Behavioral/Psych: negative Endocrine: negative Allergic/Immunologic:  negative  Physical Exam  WUJ:WJXBJ, healthy, no distress, well nourished, and well developed SKIN: skin color, texture, turgor are normal, no rashes or significant lesions HEAD: Normocephalic, No masses, lesions, tenderness or abnormalities EYES: normal, PERRLA, Conjunctiva are pink and non-injected EARS: External ears normal, Canals clear OROPHARYNX:no exudate, no erythema, and lips, buccal  mucosa, and tongue normal  NECK: supple, no adenopathy, no JVD LYMPH:  no palpable lymphadenopathy, no hepatosplenomegaly BREAST:not examined LUNGS: clear to auscultation , and palpation HEART: regular rate & rhythm, no murmurs, and no gallops ABDOMEN:abdomen soft, non-tender, normal bowel sounds, and no masses or organomegaly BACK: Back symmetric, no curvature., No CVA tenderness EXTREMITIES:no joint deformities, effusion, or inflammation, no edema  NEURO: alert & oriented x 3 with fluent speech, no focal motor/sensory deficits  PERFORMANCE STATUS: ECOG 1  LABORATORY DATA: Lab Results  Component Value Date   WBC 5.5 12/17/2017   HGB 12.5 12/17/2017   HCT 36.1 12/17/2017   MCV 86.0 12/17/2017   PLT  12/17/2017    PLATELET CLUMPS NOTED ON SMEAR, COUNT APPEARS DECREASED      Chemistry      Component Value Date/Time   NA 139 12/17/2017 1547   K 3.5 12/17/2017 1547   CL 106 12/17/2017 1547   CO2 25 12/17/2017 1547   BUN 12 12/17/2017 1547   CREATININE 1.17 (H) 12/17/2017 1547      Component Value Date/Time   CALCIUM 9.3 12/17/2017 1547   ALKPHOS 55 12/17/2017 1547   AST 28 12/17/2017 1547   ALT 22 12/17/2017 1547   BILITOT 0.8 12/17/2017 1547       RADIOGRAPHIC STUDIES: No results found.  ASSESSMENT: This is a very pleasant 89 years old white female with thrombocytopenia likely immune mediated thrombocytopenia (ITP) in a patient with history of psoriasis as well as thyroid disorder.  The patient is currently asymptomatic.   PLAN: I had a lengthy discussion with the patient and her family today about her current condition and treatment options. I ordered several studies today including repeat CBC that showed platelet count of 69,000 but the patient has normal total white blood count as well as hemoglobin and hematocrit.  Comprehensive metabolic panel is unremarkable except for elevated blood glucose of 120 and serum creatinine of 1.07.  Iron study was normal and serum  vitamin B12 was elevated.  Ferritin and serum folate are still pending. The patient is asymptomatic with no bleeding, bruises or ecchymosis. I recommended for her to continue on observation with close monitoring.  I do not see any need for any intervention unless the patient has symptoms from her low platelets including bleeding, bruises or ecchymosis or platelet count less than 50,000.  In that case I may consider her for treatment with the steroids. I will see her back for follow-up visit in around 3 months for evaluation and repeat blood work. She was advised to call immediately if she has any other concerning symptoms in the interval. The patient voices understanding of current disease status and treatment options and is in agreement with the current care plan.  All questions were answered. The patient knows to call the clinic with any problems, questions or concerns. We can certainly see the patient much sooner if necessary.  Thank you so much for allowing me to participate in the care of [name]. I will continue to follow up the patient with you and assist in her care.  The total time spent in the appointment was 60 minutes.  Disclaimer: This note was dictated with voice recognition software. Similar sounding words can inadvertently be transcribed and may not be corrected upon review.   Lajuana Matte May 21, 2022, 11:48 AM

## 2022-05-22 ENCOUNTER — Telehealth: Payer: Self-pay | Admitting: Internal Medicine

## 2022-08-19 ENCOUNTER — Inpatient Hospital Stay: Payer: Medicare HMO | Admitting: Internal Medicine

## 2022-08-19 ENCOUNTER — Inpatient Hospital Stay: Payer: Medicare HMO | Attending: Internal Medicine

## 2023-04-06 ENCOUNTER — Other Ambulatory Visit: Payer: Self-pay | Admitting: Physician Assistant

## 2023-04-06 ENCOUNTER — Ambulatory Visit
Admission: RE | Admit: 2023-04-06 | Discharge: 2023-04-06 | Disposition: A | Discharge status: Home / Self Care | Source: Ambulatory Visit | Attending: Physician Assistant | Admitting: Physician Assistant

## 2023-04-06 DIAGNOSIS — S40012A Contusion of left shoulder, initial encounter: Secondary | ICD-10-CM | POA: Diagnosis not present

## 2023-04-06 DIAGNOSIS — W19XXXA Unspecified fall, initial encounter: Secondary | ICD-10-CM | POA: Diagnosis not present

## 2023-04-06 DIAGNOSIS — S7002XA Contusion of left hip, initial encounter: Secondary | ICD-10-CM | POA: Diagnosis not present

## 2023-04-06 DIAGNOSIS — Z96642 Presence of left artificial hip joint: Secondary | ICD-10-CM | POA: Diagnosis not present

## 2023-04-06 DIAGNOSIS — R42 Dizziness and giddiness: Secondary | ICD-10-CM | POA: Diagnosis not present

## 2023-04-06 DIAGNOSIS — I951 Orthostatic hypotension: Secondary | ICD-10-CM | POA: Diagnosis not present

## 2023-04-07 ENCOUNTER — Other Ambulatory Visit: Payer: Self-pay | Admitting: Physician Assistant

## 2023-04-07 ENCOUNTER — Ambulatory Visit
Admission: RE | Admit: 2023-04-07 | Discharge: 2023-04-07 | Disposition: A | Discharge status: Home / Self Care | Source: Ambulatory Visit | Attending: Physician Assistant | Admitting: Physician Assistant

## 2023-04-07 DIAGNOSIS — R42 Dizziness and giddiness: Secondary | ICD-10-CM | POA: Diagnosis not present

## 2023-04-07 DIAGNOSIS — R58 Hemorrhage, not elsewhere classified: Secondary | ICD-10-CM

## 2023-04-12 ENCOUNTER — Other Ambulatory Visit: Payer: Self-pay | Admitting: Physician Assistant

## 2023-04-12 ENCOUNTER — Encounter: Payer: Self-pay | Admitting: Physician Assistant

## 2023-04-12 DIAGNOSIS — M25552 Pain in left hip: Secondary | ICD-10-CM

## 2023-04-12 DIAGNOSIS — Z9181 History of falling: Secondary | ICD-10-CM

## 2023-04-13 ENCOUNTER — Ambulatory Visit
Admission: RE | Admit: 2023-04-13 | Discharge: 2023-04-13 | Disposition: A | Discharge status: Home / Self Care | Source: Ambulatory Visit | Attending: Physician Assistant | Admitting: Physician Assistant

## 2023-04-13 DIAGNOSIS — S7002XA Contusion of left hip, initial encounter: Secondary | ICD-10-CM | POA: Diagnosis not present

## 2023-04-13 DIAGNOSIS — M25552 Pain in left hip: Secondary | ICD-10-CM

## 2023-04-13 DIAGNOSIS — D649 Anemia, unspecified: Secondary | ICD-10-CM | POA: Diagnosis not present

## 2023-04-13 DIAGNOSIS — Z9181 History of falling: Secondary | ICD-10-CM

## 2023-04-13 DIAGNOSIS — M1612 Unilateral primary osteoarthritis, left hip: Secondary | ICD-10-CM | POA: Diagnosis not present

## 2023-04-21 DIAGNOSIS — F02B18 Dementia in other diseases classified elsewhere, moderate, with other behavioral disturbance: Secondary | ICD-10-CM | POA: Diagnosis not present

## 2023-04-21 DIAGNOSIS — R296 Repeated falls: Secondary | ICD-10-CM | POA: Diagnosis not present

## 2023-04-21 DIAGNOSIS — D696 Thrombocytopenia, unspecified: Secondary | ICD-10-CM | POA: Diagnosis not present

## 2023-04-21 DIAGNOSIS — G301 Alzheimer's disease with late onset: Secondary | ICD-10-CM | POA: Diagnosis not present

## 2023-04-21 DIAGNOSIS — T148XXA Other injury of unspecified body region, initial encounter: Secondary | ICD-10-CM | POA: Diagnosis not present

## 2023-04-21 DIAGNOSIS — R32 Unspecified urinary incontinence: Secondary | ICD-10-CM | POA: Diagnosis not present

## 2023-04-21 DIAGNOSIS — R42 Dizziness and giddiness: Secondary | ICD-10-CM | POA: Diagnosis not present

## 2023-04-30 DIAGNOSIS — R062 Wheezing: Secondary | ICD-10-CM | POA: Diagnosis not present

## 2023-04-30 DIAGNOSIS — I700 Atherosclerosis of aorta: Secondary | ICD-10-CM | POA: Diagnosis not present

## 2023-04-30 DIAGNOSIS — R918 Other nonspecific abnormal finding of lung field: Secondary | ICD-10-CM | POA: Diagnosis not present

## 2023-04-30 DIAGNOSIS — J9801 Acute bronchospasm: Secondary | ICD-10-CM | POA: Diagnosis not present

## 2023-04-30 DIAGNOSIS — R9389 Abnormal findings on diagnostic imaging of other specified body structures: Secondary | ICD-10-CM | POA: Diagnosis not present

## 2023-04-30 DIAGNOSIS — R051 Acute cough: Secondary | ICD-10-CM | POA: Diagnosis not present

## 2023-04-30 DIAGNOSIS — Z03818 Encounter for observation for suspected exposure to other biological agents ruled out: Secondary | ICD-10-CM | POA: Diagnosis not present

## 2023-05-05 DIAGNOSIS — R42 Dizziness and giddiness: Secondary | ICD-10-CM | POA: Diagnosis not present

## 2023-05-05 DIAGNOSIS — R400 Somnolence: Secondary | ICD-10-CM | POA: Diagnosis not present

## 2023-05-05 DIAGNOSIS — K625 Hemorrhage of anus and rectum: Secondary | ICD-10-CM | POA: Diagnosis not present

## 2023-05-05 DIAGNOSIS — F0390 Unspecified dementia without behavioral disturbance: Secondary | ICD-10-CM | POA: Diagnosis not present

## 2023-05-17 ENCOUNTER — Other Ambulatory Visit (HOSPITAL_COMMUNITY): Payer: Self-pay | Admitting: Family Medicine

## 2023-05-17 ENCOUNTER — Ambulatory Visit (HOSPITAL_COMMUNITY)
Admission: RE | Admit: 2023-05-17 | Discharge: 2023-05-17 | Disposition: A | Discharge status: Home / Self Care | Source: Ambulatory Visit | Attending: Surgery | Admitting: Surgery

## 2023-05-17 DIAGNOSIS — M79662 Pain in left lower leg: Secondary | ICD-10-CM

## 2023-05-17 DIAGNOSIS — R600 Localized edema: Secondary | ICD-10-CM

## 2023-05-17 DIAGNOSIS — I739 Peripheral vascular disease, unspecified: Secondary | ICD-10-CM

## 2023-05-17 DIAGNOSIS — G309 Alzheimer's disease, unspecified: Secondary | ICD-10-CM | POA: Diagnosis not present

## 2023-05-17 DIAGNOSIS — Z9189 Other specified personal risk factors, not elsewhere classified: Secondary | ICD-10-CM | POA: Diagnosis not present

## 2023-05-17 LAB — VAS US ABI WITH/WO TBI
Left ABI: 0.97
Right ABI: 1.09

## 2023-05-18 ENCOUNTER — Encounter (HOSPITAL_COMMUNITY)

## 2023-05-18 DIAGNOSIS — K625 Hemorrhage of anus and rectum: Secondary | ICD-10-CM | POA: Diagnosis not present

## 2023-05-18 DIAGNOSIS — R230 Cyanosis: Secondary | ICD-10-CM | POA: Diagnosis not present

## 2023-05-18 DIAGNOSIS — M1990 Unspecified osteoarthritis, unspecified site: Secondary | ICD-10-CM | POA: Diagnosis not present

## 2023-05-18 DIAGNOSIS — I10 Essential (primary) hypertension: Secondary | ICD-10-CM | POA: Diagnosis not present

## 2023-05-18 DIAGNOSIS — I739 Peripheral vascular disease, unspecified: Secondary | ICD-10-CM | POA: Diagnosis not present

## 2023-05-18 DIAGNOSIS — S7002XD Contusion of left hip, subsequent encounter: Secondary | ICD-10-CM | POA: Diagnosis not present

## 2023-05-18 DIAGNOSIS — F0280 Dementia in other diseases classified elsewhere without behavioral disturbance: Secondary | ICD-10-CM | POA: Diagnosis not present

## 2023-05-18 DIAGNOSIS — G309 Alzheimer's disease, unspecified: Secondary | ICD-10-CM | POA: Diagnosis not present

## 2023-05-18 DIAGNOSIS — K5732 Diverticulitis of large intestine without perforation or abscess without bleeding: Secondary | ICD-10-CM | POA: Diagnosis not present

## 2023-05-24 ENCOUNTER — Ambulatory Visit: Attending: Surgery | Admitting: Surgery

## 2023-05-24 ENCOUNTER — Encounter: Payer: Self-pay | Admitting: Surgery

## 2023-05-24 VITALS — BP 130/77 | HR 81 | Temp 98.2°F | Ht 63.0 in

## 2023-05-24 DIAGNOSIS — I739 Peripheral vascular disease, unspecified: Secondary | ICD-10-CM

## 2023-05-24 DIAGNOSIS — M7989 Other specified soft tissue disorders: Secondary | ICD-10-CM

## 2023-05-24 DIAGNOSIS — R6889 Other general symptoms and signs: Secondary | ICD-10-CM

## 2023-05-24 NOTE — Progress Notes (Signed)
 [facility] [place]  Patient name: [name] MRN: [id number] DOB: April 20, 1926 Sex: female   REQUESTING PROVIDER:    Jared Merle   REASON FOR CONSULT:    Abnormal ABI  HISTORY OF PRESENT ILLNESS:   [name] is a 89 y.o. female, who is referred for abnormal ABIs.  She states that she fell about a month ago and since that time has been having bluish discoloration of her left leg.  At baseline she has bilateral leg swelling.  She has tried compression socks but has been unable to tolerate them.  She is also dealing with thrombocytopenia.  She has been nonambulatory since her fall about a month ago.  According to her daughter her leg will turn blue and then she complains of pain while she is sleeping as she does moan.  PAST MEDICAL HISTORY    Past Medical History:  Diagnosis Date   COLONIC POLYPS, HX OF 08/02/2006   DIVERTICULOSIS, COLON 08/02/2006   Elevated liver enzymes    GERD 08/02/2006   HYPERTENSION 08/02/2006   HYPERTHYROIDISM 08/02/2006   Iron deficiency anemia secondary to blood loss (chronic)    OSTEOARTHRITIS 08/02/2006   Psoriasis    SCC (squamous cell carcinoma), scalp/neck    Upper GI bleed      FAMILY HISTORY   Family History  Problem Relation Age of Onset   Pancreatic cancer Mother    Stroke Father    Heart attack Brother    Rheumatic fever Sister 21   Peripheral Artery Disease Sister        Right carotid endarterectomy    SOCIAL HISTORY:   Social History   Socioeconomic History   Marital status: Widowed    Spouse name: Not on file   Number of children: 1   Years of education: Not on file   Highest education level: Not on file  Occupational History   Occupation: Retired  Tobacco Use   Smoking status: Never   Smokeless tobacco: Never  Vaping Use   Vaping status: Never Used  Substance and Sexual Activity   Alcohol use: No   Drug use: No   Sexual activity: Not on file  Other Topics  Concern   Not on file  Social History Narrative   Widowed mother of 1, grandmother of 2, great-grandmother of 2.     Retired Psychiatric nurse for [employer]   She says she does not exercise because she is "lazy ".-Currently limited by hip pain.   Social Drivers of Corporate investment banker Strain: Not on file  Food Insecurity: Not on file  Transportation Needs: Not on file  Physical Activity: Not on file  Stress: Not on file  Social Connections: Not on file  Intimate Partner Violence: Not on file    ALLERGIES:    No Known Allergies  CURRENT MEDICATIONS:    Current Outpatient Medications  Medication Sig Dispense Refill   albuterol (ACCUNEB) 0.63 MG/3ML nebulizer solution Take 1 ampule by nebulization every 6 (six) hours as needed for wheezing.     mirtazapine (REMERON) 15 MG tablet Take 15 mg by mouth at bedtime.     Misc Natural Products (BEET ROOT PO) Take 4 fluid ounces by mouth daily. Total beet powder " to build blood"     Multiple Vitamins-Minerals (MULTIVITAMIN WITH MINERALS) tablet Take 1 tablet by mouth daily. MVI Wellness packet     OVER THE COUNTER MEDICATION Take 1 Dose by mouth daily. Melaluca Wellness Packet Peak  performance"     ibuprofen (ADVIL,MOTRIN) 200 MG tablet Take 200 mg by mouth every 6 (six) hours as needed. (Patient not taking: Reported on 05/21/2022)     No current facility-administered medications for this visit.    REVIEW OF SYSTEMS:   [X]  denotes positive finding, [ ]  denotes negative finding Cardiac  Comments:  Chest pain or chest pressure:    Shortness of breath upon exertion:    Short of breath when lying flat:    Irregular heart rhythm:        Vascular    Pain in calf, thigh, or hip brought on by ambulation:    Pain in feet at night that wakes you up from your sleep:     Blood clot in your veins:    Leg swelling:  x       Pulmonary    Oxygen at home:    Productive cough:     Wheezing:         Neurologic    Sudden weakness in  arms or legs:     Sudden numbness in arms or legs:     Sudden onset of difficulty speaking or slurred speech:    Temporary loss of vision in one eye:     Problems with dizziness:         Gastrointestinal    Blood in stool:      Vomited blood:         Genitourinary    Burning when urinating:     Blood in urine:        Psychiatric    Major depression:         Hematologic    Bleeding problems:    Problems with blood clotting too easily:        Skin    Rashes or ulcers:        Constitutional    Fever or chills:     PHYSICAL EXAM:   Vitals:   05/24/23 0827  BP: 130/77  Pulse: 81  Temp: 98.2 F (36.8 C)  SpO2: 90%  Height: 5\' 3"  (1.6 m)    GENERAL: The patient is a well-nourished female, in no acute distress. The vital signs are documented above. CARDIAC: There is a regular rate and rhythm.  VASCULAR: Bilateral lower extremity edema which prevented me from palpating pedal pulses PULMONARY: Nonlabored respirations MUSCULOSKELETAL: There are no major deformities or cyanosis. NEUROLOGIC: No focal weakness or paresthesias are detected. SKIN: There are no ulcers or rashes noted. PSYCHIATRIC: The patient has a normal affect.  STUDIES:   I have reviewed the following:  ABI/TBIToday's ABIToday's TBIPrevious ABIPrevious TBI  +-------+-----------+-----------+------------+------------+  Right 1.09       .93                                  +-------+-----------+-----------+------------+------------+  Left  .97        .60                                  +-------+-----------+-----------+------------+------------+  Right toe pressure: 150 Left toe pressure: 97 Waveforms are triphasic on the right and multiphasic on the left   Venous duplex: RIGHT:  - No evidence of common femoral vein obstruction.    LEFT:  - No evidence of deep vein thrombosis in the lower extremity. No indirect  evidence of obstruction  proximal to the inguinal ligament.  - No  cystic structure found in the popliteal fossa.      ASSESSMENT and PLAN   Leg pain: I reassured the patient and her family that despite an abnormal impression from her ABIs, I do not think she has significant arterial insufficiency.  Both toe pressures are above the lower limit threshold.  Her ABIs 0.97 on the left and 1.09 on the right with excellent waveforms.  Her venous study did not show any evidence of DVT.  She potentially could have venous reflux however this would be treated conservatively.  I discussed the importance of leg elevation.  I am going to try to get her into a low compression stocking.  I also stressed the importance of walking and getting physical therapy involved so that we can utilize her calf muscle pump.   Marti Slates, MD, FACS [facility] [phone] Pager [phone]

## 2023-05-25 DIAGNOSIS — K5732 Diverticulitis of large intestine without perforation or abscess without bleeding: Secondary | ICD-10-CM | POA: Diagnosis not present

## 2023-05-25 DIAGNOSIS — S7002XD Contusion of left hip, subsequent encounter: Secondary | ICD-10-CM | POA: Diagnosis not present

## 2023-05-25 DIAGNOSIS — M1990 Unspecified osteoarthritis, unspecified site: Secondary | ICD-10-CM | POA: Diagnosis not present

## 2023-05-25 DIAGNOSIS — R230 Cyanosis: Secondary | ICD-10-CM | POA: Diagnosis not present

## 2023-05-25 DIAGNOSIS — I739 Peripheral vascular disease, unspecified: Secondary | ICD-10-CM | POA: Diagnosis not present

## 2023-05-25 DIAGNOSIS — F0280 Dementia in other diseases classified elsewhere without behavioral disturbance: Secondary | ICD-10-CM | POA: Diagnosis not present

## 2023-05-25 DIAGNOSIS — G309 Alzheimer's disease, unspecified: Secondary | ICD-10-CM | POA: Diagnosis not present

## 2023-05-25 DIAGNOSIS — K625 Hemorrhage of anus and rectum: Secondary | ICD-10-CM | POA: Diagnosis not present

## 2023-05-25 DIAGNOSIS — I10 Essential (primary) hypertension: Secondary | ICD-10-CM | POA: Diagnosis not present

## 2023-05-26 DIAGNOSIS — R54 Age-related physical debility: Secondary | ICD-10-CM | POA: Diagnosis not present

## 2023-05-26 DIAGNOSIS — F0390 Unspecified dementia without behavioral disturbance: Secondary | ICD-10-CM | POA: Diagnosis not present

## 2023-05-26 DIAGNOSIS — D696 Thrombocytopenia, unspecified: Secondary | ICD-10-CM | POA: Diagnosis not present

## 2023-05-28 DIAGNOSIS — I10 Essential (primary) hypertension: Secondary | ICD-10-CM | POA: Diagnosis not present

## 2023-05-28 DIAGNOSIS — F0280 Dementia in other diseases classified elsewhere without behavioral disturbance: Secondary | ICD-10-CM | POA: Diagnosis not present

## 2023-05-28 DIAGNOSIS — G309 Alzheimer's disease, unspecified: Secondary | ICD-10-CM | POA: Diagnosis not present

## 2023-05-28 DIAGNOSIS — S7002XD Contusion of left hip, subsequent encounter: Secondary | ICD-10-CM | POA: Diagnosis not present

## 2023-05-28 DIAGNOSIS — K5732 Diverticulitis of large intestine without perforation or abscess without bleeding: Secondary | ICD-10-CM | POA: Diagnosis not present

## 2023-05-28 DIAGNOSIS — M1990 Unspecified osteoarthritis, unspecified site: Secondary | ICD-10-CM | POA: Diagnosis not present

## 2023-05-28 DIAGNOSIS — I739 Peripheral vascular disease, unspecified: Secondary | ICD-10-CM | POA: Diagnosis not present

## 2023-05-28 DIAGNOSIS — R230 Cyanosis: Secondary | ICD-10-CM | POA: Diagnosis not present

## 2023-05-28 DIAGNOSIS — K625 Hemorrhage of anus and rectum: Secondary | ICD-10-CM | POA: Diagnosis not present

## 2023-05-31 DIAGNOSIS — K625 Hemorrhage of anus and rectum: Secondary | ICD-10-CM | POA: Diagnosis not present

## 2023-05-31 DIAGNOSIS — R230 Cyanosis: Secondary | ICD-10-CM | POA: Diagnosis not present

## 2023-05-31 DIAGNOSIS — S7002XD Contusion of left hip, subsequent encounter: Secondary | ICD-10-CM | POA: Diagnosis not present

## 2023-05-31 DIAGNOSIS — G309 Alzheimer's disease, unspecified: Secondary | ICD-10-CM | POA: Diagnosis not present

## 2023-05-31 DIAGNOSIS — K5732 Diverticulitis of large intestine without perforation or abscess without bleeding: Secondary | ICD-10-CM | POA: Diagnosis not present

## 2023-05-31 DIAGNOSIS — I10 Essential (primary) hypertension: Secondary | ICD-10-CM | POA: Diagnosis not present

## 2023-05-31 DIAGNOSIS — F0280 Dementia in other diseases classified elsewhere without behavioral disturbance: Secondary | ICD-10-CM | POA: Diagnosis not present

## 2023-05-31 DIAGNOSIS — M1990 Unspecified osteoarthritis, unspecified site: Secondary | ICD-10-CM | POA: Diagnosis not present

## 2023-05-31 DIAGNOSIS — I739 Peripheral vascular disease, unspecified: Secondary | ICD-10-CM | POA: Diagnosis not present

## 2023-06-01 DIAGNOSIS — K625 Hemorrhage of anus and rectum: Secondary | ICD-10-CM | POA: Diagnosis not present

## 2023-06-01 DIAGNOSIS — S7002XD Contusion of left hip, subsequent encounter: Secondary | ICD-10-CM | POA: Diagnosis not present

## 2023-06-01 DIAGNOSIS — R230 Cyanosis: Secondary | ICD-10-CM | POA: Diagnosis not present

## 2023-06-01 DIAGNOSIS — I10 Essential (primary) hypertension: Secondary | ICD-10-CM | POA: Diagnosis not present

## 2023-06-01 DIAGNOSIS — M1990 Unspecified osteoarthritis, unspecified site: Secondary | ICD-10-CM | POA: Diagnosis not present

## 2023-06-01 DIAGNOSIS — K5732 Diverticulitis of large intestine without perforation or abscess without bleeding: Secondary | ICD-10-CM | POA: Diagnosis not present

## 2023-06-01 DIAGNOSIS — I739 Peripheral vascular disease, unspecified: Secondary | ICD-10-CM | POA: Diagnosis not present

## 2023-06-01 DIAGNOSIS — F0280 Dementia in other diseases classified elsewhere without behavioral disturbance: Secondary | ICD-10-CM | POA: Diagnosis not present

## 2023-06-01 DIAGNOSIS — G309 Alzheimer's disease, unspecified: Secondary | ICD-10-CM | POA: Diagnosis not present

## 2023-06-03 DIAGNOSIS — G309 Alzheimer's disease, unspecified: Secondary | ICD-10-CM | POA: Diagnosis not present

## 2023-06-03 DIAGNOSIS — S7002XD Contusion of left hip, subsequent encounter: Secondary | ICD-10-CM | POA: Diagnosis not present

## 2023-06-03 DIAGNOSIS — K5732 Diverticulitis of large intestine without perforation or abscess without bleeding: Secondary | ICD-10-CM | POA: Diagnosis not present

## 2023-06-03 DIAGNOSIS — K625 Hemorrhage of anus and rectum: Secondary | ICD-10-CM | POA: Diagnosis not present

## 2023-06-03 DIAGNOSIS — I10 Essential (primary) hypertension: Secondary | ICD-10-CM | POA: Diagnosis not present

## 2023-06-03 DIAGNOSIS — M1990 Unspecified osteoarthritis, unspecified site: Secondary | ICD-10-CM | POA: Diagnosis not present

## 2023-06-03 DIAGNOSIS — I739 Peripheral vascular disease, unspecified: Secondary | ICD-10-CM | POA: Diagnosis not present

## 2023-06-03 DIAGNOSIS — R230 Cyanosis: Secondary | ICD-10-CM | POA: Diagnosis not present

## 2023-06-03 DIAGNOSIS — F0280 Dementia in other diseases classified elsewhere without behavioral disturbance: Secondary | ICD-10-CM | POA: Diagnosis not present

## 2023-06-07 DIAGNOSIS — S7002XD Contusion of left hip, subsequent encounter: Secondary | ICD-10-CM | POA: Diagnosis not present

## 2023-06-07 DIAGNOSIS — R230 Cyanosis: Secondary | ICD-10-CM | POA: Diagnosis not present

## 2023-06-07 DIAGNOSIS — K5732 Diverticulitis of large intestine without perforation or abscess without bleeding: Secondary | ICD-10-CM | POA: Diagnosis not present

## 2023-06-07 DIAGNOSIS — I739 Peripheral vascular disease, unspecified: Secondary | ICD-10-CM | POA: Diagnosis not present

## 2023-06-07 DIAGNOSIS — F0280 Dementia in other diseases classified elsewhere without behavioral disturbance: Secondary | ICD-10-CM | POA: Diagnosis not present

## 2023-06-07 DIAGNOSIS — G309 Alzheimer's disease, unspecified: Secondary | ICD-10-CM | POA: Diagnosis not present

## 2023-06-07 DIAGNOSIS — M1990 Unspecified osteoarthritis, unspecified site: Secondary | ICD-10-CM | POA: Diagnosis not present

## 2023-06-07 DIAGNOSIS — K625 Hemorrhage of anus and rectum: Secondary | ICD-10-CM | POA: Diagnosis not present

## 2023-06-07 DIAGNOSIS — I10 Essential (primary) hypertension: Secondary | ICD-10-CM | POA: Diagnosis not present

## 2023-06-08 DIAGNOSIS — I739 Peripheral vascular disease, unspecified: Secondary | ICD-10-CM | POA: Diagnosis not present

## 2023-06-08 DIAGNOSIS — F0280 Dementia in other diseases classified elsewhere without behavioral disturbance: Secondary | ICD-10-CM | POA: Diagnosis not present

## 2023-06-08 DIAGNOSIS — S7002XD Contusion of left hip, subsequent encounter: Secondary | ICD-10-CM | POA: Diagnosis not present

## 2023-06-08 DIAGNOSIS — M1990 Unspecified osteoarthritis, unspecified site: Secondary | ICD-10-CM | POA: Diagnosis not present

## 2023-06-08 DIAGNOSIS — K5732 Diverticulitis of large intestine without perforation or abscess without bleeding: Secondary | ICD-10-CM | POA: Diagnosis not present

## 2023-06-08 DIAGNOSIS — K625 Hemorrhage of anus and rectum: Secondary | ICD-10-CM | POA: Diagnosis not present

## 2023-06-08 DIAGNOSIS — G309 Alzheimer's disease, unspecified: Secondary | ICD-10-CM | POA: Diagnosis not present

## 2023-06-08 DIAGNOSIS — R230 Cyanosis: Secondary | ICD-10-CM | POA: Diagnosis not present

## 2023-06-08 DIAGNOSIS — I10 Essential (primary) hypertension: Secondary | ICD-10-CM | POA: Diagnosis not present

## 2023-06-15 DIAGNOSIS — M1990 Unspecified osteoarthritis, unspecified site: Secondary | ICD-10-CM | POA: Diagnosis not present

## 2023-06-15 DIAGNOSIS — R230 Cyanosis: Secondary | ICD-10-CM | POA: Diagnosis not present

## 2023-06-15 DIAGNOSIS — F0280 Dementia in other diseases classified elsewhere without behavioral disturbance: Secondary | ICD-10-CM | POA: Diagnosis not present

## 2023-06-15 DIAGNOSIS — G309 Alzheimer's disease, unspecified: Secondary | ICD-10-CM | POA: Diagnosis not present

## 2023-06-15 DIAGNOSIS — K5732 Diverticulitis of large intestine without perforation or abscess without bleeding: Secondary | ICD-10-CM | POA: Diagnosis not present

## 2023-06-15 DIAGNOSIS — I10 Essential (primary) hypertension: Secondary | ICD-10-CM | POA: Diagnosis not present

## 2023-06-15 DIAGNOSIS — S7002XD Contusion of left hip, subsequent encounter: Secondary | ICD-10-CM | POA: Diagnosis not present

## 2023-06-15 DIAGNOSIS — I739 Peripheral vascular disease, unspecified: Secondary | ICD-10-CM | POA: Diagnosis not present

## 2023-06-15 DIAGNOSIS — K625 Hemorrhage of anus and rectum: Secondary | ICD-10-CM | POA: Diagnosis not present

## 2023-06-17 DIAGNOSIS — R918 Other nonspecific abnormal finding of lung field: Secondary | ICD-10-CM | POA: Diagnosis not present

## 2023-06-17 DIAGNOSIS — R9389 Abnormal findings on diagnostic imaging of other specified body structures: Secondary | ICD-10-CM | POA: Diagnosis not present

## 2023-06-17 DIAGNOSIS — D696 Thrombocytopenia, unspecified: Secondary | ICD-10-CM | POA: Diagnosis not present

## 2023-06-17 DIAGNOSIS — I509 Heart failure, unspecified: Secondary | ICD-10-CM | POA: Diagnosis not present

## 2023-06-18 DIAGNOSIS — S7002XD Contusion of left hip, subsequent encounter: Secondary | ICD-10-CM | POA: Diagnosis not present

## 2023-06-18 DIAGNOSIS — F0280 Dementia in other diseases classified elsewhere without behavioral disturbance: Secondary | ICD-10-CM | POA: Diagnosis not present

## 2023-06-18 DIAGNOSIS — K5732 Diverticulitis of large intestine without perforation or abscess without bleeding: Secondary | ICD-10-CM | POA: Diagnosis not present

## 2023-06-18 DIAGNOSIS — G309 Alzheimer's disease, unspecified: Secondary | ICD-10-CM | POA: Diagnosis not present

## 2023-06-18 DIAGNOSIS — I10 Essential (primary) hypertension: Secondary | ICD-10-CM | POA: Diagnosis not present

## 2023-06-18 DIAGNOSIS — M1990 Unspecified osteoarthritis, unspecified site: Secondary | ICD-10-CM | POA: Diagnosis not present

## 2023-06-18 DIAGNOSIS — I739 Peripheral vascular disease, unspecified: Secondary | ICD-10-CM | POA: Diagnosis not present

## 2023-06-18 DIAGNOSIS — R230 Cyanosis: Secondary | ICD-10-CM | POA: Diagnosis not present

## 2023-06-18 DIAGNOSIS — K625 Hemorrhage of anus and rectum: Secondary | ICD-10-CM | POA: Diagnosis not present

## 2023-06-19 DIAGNOSIS — G309 Alzheimer's disease, unspecified: Secondary | ICD-10-CM | POA: Diagnosis not present

## 2023-06-19 DIAGNOSIS — M1990 Unspecified osteoarthritis, unspecified site: Secondary | ICD-10-CM | POA: Diagnosis not present

## 2023-06-19 DIAGNOSIS — I10 Essential (primary) hypertension: Secondary | ICD-10-CM | POA: Diagnosis not present

## 2023-06-19 DIAGNOSIS — S7002XD Contusion of left hip, subsequent encounter: Secondary | ICD-10-CM | POA: Diagnosis not present

## 2023-06-19 DIAGNOSIS — F0280 Dementia in other diseases classified elsewhere without behavioral disturbance: Secondary | ICD-10-CM | POA: Diagnosis not present

## 2023-06-19 DIAGNOSIS — R230 Cyanosis: Secondary | ICD-10-CM | POA: Diagnosis not present

## 2023-06-19 DIAGNOSIS — I739 Peripheral vascular disease, unspecified: Secondary | ICD-10-CM | POA: Diagnosis not present

## 2023-06-19 DIAGNOSIS — K625 Hemorrhage of anus and rectum: Secondary | ICD-10-CM | POA: Diagnosis not present

## 2023-06-19 DIAGNOSIS — K5732 Diverticulitis of large intestine without perforation or abscess without bleeding: Secondary | ICD-10-CM | POA: Diagnosis not present

## 2023-06-23 DIAGNOSIS — R0600 Dyspnea, unspecified: Secondary | ICD-10-CM | POA: Diagnosis not present

## 2023-06-24 DIAGNOSIS — R230 Cyanosis: Secondary | ICD-10-CM | POA: Diagnosis not present

## 2023-06-24 DIAGNOSIS — I10 Essential (primary) hypertension: Secondary | ICD-10-CM | POA: Diagnosis not present

## 2023-06-24 DIAGNOSIS — K625 Hemorrhage of anus and rectum: Secondary | ICD-10-CM | POA: Diagnosis not present

## 2023-06-24 DIAGNOSIS — M1990 Unspecified osteoarthritis, unspecified site: Secondary | ICD-10-CM | POA: Diagnosis not present

## 2023-06-24 DIAGNOSIS — K5732 Diverticulitis of large intestine without perforation or abscess without bleeding: Secondary | ICD-10-CM | POA: Diagnosis not present

## 2023-06-24 DIAGNOSIS — G309 Alzheimer's disease, unspecified: Secondary | ICD-10-CM | POA: Diagnosis not present

## 2023-06-24 DIAGNOSIS — I739 Peripheral vascular disease, unspecified: Secondary | ICD-10-CM | POA: Diagnosis not present

## 2023-06-24 DIAGNOSIS — F0280 Dementia in other diseases classified elsewhere without behavioral disturbance: Secondary | ICD-10-CM | POA: Diagnosis not present

## 2023-06-24 DIAGNOSIS — S7002XD Contusion of left hip, subsequent encounter: Secondary | ICD-10-CM | POA: Diagnosis not present

## 2023-06-25 DIAGNOSIS — R54 Age-related physical debility: Secondary | ICD-10-CM | POA: Diagnosis not present

## 2023-06-25 DIAGNOSIS — G301 Alzheimer's disease with late onset: Secondary | ICD-10-CM | POA: Diagnosis not present

## 2023-06-25 DIAGNOSIS — I341 Nonrheumatic mitral (valve) prolapse: Secondary | ICD-10-CM | POA: Diagnosis not present

## 2023-06-25 DIAGNOSIS — I509 Heart failure, unspecified: Secondary | ICD-10-CM | POA: Diagnosis not present

## 2023-06-25 DIAGNOSIS — Z515 Encounter for palliative care: Secondary | ICD-10-CM | POA: Diagnosis not present

## 2023-06-25 DIAGNOSIS — Z66 Do not resuscitate: Secondary | ICD-10-CM | POA: Diagnosis not present

## 2023-06-30 ENCOUNTER — Ambulatory Visit: Admitting: Cardiology

## 2023-07-20 DEATH — deceased
